# Patient Record
Sex: Female | Born: 1990 | Race: White | Hispanic: No | Marital: Single | Smoking: Never smoker
Health system: Southern US, Community
[De-identification: ages and names within clinical notes are randomized; demographics above are authoritative.]

## PROBLEM LIST (undated history)

## (undated) DIAGNOSIS — D649 Anemia, unspecified: Secondary | ICD-10-CM

## (undated) DIAGNOSIS — I499 Cardiac arrhythmia, unspecified: Secondary | ICD-10-CM

## (undated) DIAGNOSIS — R011 Cardiac murmur, unspecified: Secondary | ICD-10-CM

## (undated) DIAGNOSIS — E0590 Thyrotoxicosis, unspecified without thyrotoxic crisis or storm: Secondary | ICD-10-CM

## (undated) DIAGNOSIS — E079 Disorder of thyroid, unspecified: Secondary | ICD-10-CM

## (undated) HISTORY — DX: Cardiac arrhythmia, unspecified: I49.9

## (undated) HISTORY — DX: Cardiac murmur, unspecified: R01.1

## (undated) HISTORY — DX: Thyrotoxicosis, unspecified without thyrotoxic crisis or storm: E05.90

---

## 2018-04-29 ENCOUNTER — Encounter (HOSPITAL_COMMUNITY): Payer: Self-pay

## 2018-04-29 ENCOUNTER — Other Ambulatory Visit: Payer: Self-pay

## 2018-04-29 ENCOUNTER — Emergency Department (HOSPITAL_COMMUNITY): Payer: Medicaid Other

## 2018-04-29 ENCOUNTER — Emergency Department (HOSPITAL_COMMUNITY)
Admission: EM | Admit: 2018-04-29 | Discharge: 2018-04-29 | Disposition: A | Discharge status: Home / Self Care | Payer: Medicaid Other | Attending: Emergency Medicine | Admitting: Emergency Medicine

## 2018-04-29 DIAGNOSIS — R1030 Lower abdominal pain, unspecified: Secondary | ICD-10-CM | POA: Diagnosis not present

## 2018-04-29 DIAGNOSIS — O9989 Other specified diseases and conditions complicating pregnancy, childbirth and the puerperium: Secondary | ICD-10-CM | POA: Insufficient documentation

## 2018-04-29 DIAGNOSIS — Z3A15 15 weeks gestation of pregnancy: Secondary | ICD-10-CM

## 2018-04-29 DIAGNOSIS — N939 Abnormal uterine and vaginal bleeding, unspecified: Secondary | ICD-10-CM

## 2018-04-29 DIAGNOSIS — W19XXXS Unspecified fall, sequela: Secondary | ICD-10-CM

## 2018-04-29 HISTORY — DX: Disorder of thyroid, unspecified: E07.9

## 2018-04-29 HISTORY — DX: Anemia, unspecified: D64.9

## 2018-04-29 LAB — COMPREHENSIVE METABOLIC PANEL
ALK PHOS: 56 U/L (ref 38–126)
ALT: 10 U/L (ref 0–44)
AST: 13 U/L — AB (ref 15–41)
Albumin: 3.2 g/dL — ABNORMAL LOW (ref 3.5–5.0)
Anion gap: 7 (ref 5–15)
BUN: 8 mg/dL (ref 6–20)
CALCIUM: 8.9 mg/dL (ref 8.9–10.3)
CHLORIDE: 107 mmol/L (ref 98–111)
CO2: 23 mmol/L (ref 22–32)
Creatinine, Ser: 0.51 mg/dL (ref 0.44–1.00)
Glucose, Bld: 98 mg/dL (ref 70–99)
Potassium: 3.5 mmol/L (ref 3.5–5.1)
Sodium: 137 mmol/L (ref 135–145)
Total Bilirubin: 0.4 mg/dL (ref 0.3–1.2)
Total Protein: 6.6 g/dL (ref 6.5–8.1)

## 2018-04-29 LAB — CBC WITH DIFFERENTIAL/PLATELET
Abs Immature Granulocytes: 0 10*3/uL (ref 0.0–0.1)
Basophils Absolute: 0 10*3/uL (ref 0.0–0.1)
Basophils Relative: 0 %
Eosinophils Absolute: 0.1 10*3/uL (ref 0.0–0.7)
Eosinophils Relative: 1 %
HEMATOCRIT: 34.9 % — AB (ref 36.0–46.0)
HEMOGLOBIN: 11.5 g/dL — AB (ref 12.0–15.0)
IMMATURE GRANULOCYTES: 0 %
LYMPHS ABS: 2.4 10*3/uL (ref 0.7–4.0)
LYMPHS PCT: 30 %
MCH: 29.8 pg (ref 26.0–34.0)
MCHC: 33 g/dL (ref 30.0–36.0)
MCV: 90.4 fL (ref 78.0–100.0)
Monocytes Absolute: 0.5 10*3/uL (ref 0.1–1.0)
Monocytes Relative: 6 %
NEUTROS PCT: 63 %
Neutro Abs: 5.1 10*3/uL (ref 1.7–7.7)
Platelets: 259 10*3/uL (ref 150–400)
RBC: 3.86 MIL/uL — AB (ref 3.87–5.11)
RDW: 12.2 % (ref 11.5–15.5)
WBC: 8.1 10*3/uL (ref 4.0–10.5)

## 2018-04-29 LAB — TYPE AND SCREEN
ABO/RH(D): O POS
ANTIBODY SCREEN: NEGATIVE

## 2018-04-29 LAB — HCG, QUANTITATIVE, PREGNANCY: HCG, BETA CHAIN, QUANT, S: 71929 m[IU]/mL — AB (ref <5)

## 2018-04-29 LAB — URINALYSIS, ROUTINE W REFLEX MICROSCOPIC
BILIRUBIN URINE: NEGATIVE
Glucose, UA: NEGATIVE mg/dL
Hgb urine dipstick: NEGATIVE
Ketones, ur: NEGATIVE mg/dL
Leukocytes, UA: NEGATIVE
NITRITE: NEGATIVE
PH: 6 (ref 5.0–8.0)
PROTEIN: NEGATIVE mg/dL
Specific Gravity, Urine: 1.025 (ref 1.005–1.030)

## 2018-04-29 LAB — I-STAT BETA HCG BLOOD, ED (MC, WL, AP ONLY): I-stat hCG, quantitative: >2000 m[IU]/mL — ABNORMAL HIGH (ref <5)

## 2018-04-29 LAB — ABO/RH: ABO/RH(D): O POS

## 2018-04-29 NOTE — ED Provider Notes (Signed)
[facility]ENC[facility] Provider Note   CSN: [id number] Arrival date & time: 04/29/18  1535     History   Chief Complaint Chief Complaint  Patient presents with  . Abdominal Pain    HPI Martha Macias is a 27 y.o. pregnant female with a history of hyperthyroidism who presents for abdominal pain and vaginal bleeding after a fall yesterday. She reports that she tripped over a bucket and landed on the bucket on her lower stomach and upper thighs. Since then she has had lower back pain across the whole back and lower abdominal pain. This morning she began to feel abdominal cramping and noticed minimal spotting. She denies any vaginal discharge or dysuria. Her LMP was 3/30 and home pregnancy tests have been positive, but she has not yet seen an ob-gyn for prenatal care.   Past Medical History:  Diagnosis Date  . Anemia   . Thyroid disease     There are no active problems to display for this patient.   History reviewed. No pertinent surgical history.   OB History: G6P5. All vaginal deliveries.  Home Medications    Prior to Admission medications   Not on File    Family History No family history on file.  Social History Social History   Tobacco Use  . Smoking status: Never Smoker  . Smokeless tobacco: Never Used  Substance Use Topics  . Alcohol use: Never    Frequency: Never  . Drug use: Never     Allergies   Patient has no known allergies.   Review of Systems Review of Systems  Constitutional: Negative for chills and fatigue.  HENT: Negative for sinus pain and sore throat.   Respiratory: Negative for cough and shortness of breath.   Cardiovascular: Negative for chest pain and leg swelling.  Gastrointestinal: Positive for abdominal pain. Negative for nausea and vomiting.  Genitourinary: Positive for vaginal bleeding. Negative for difficulty urinating, dysuria, flank pain, pelvic pain, vaginal discharge and vaginal pain.    Musculoskeletal: Positive for back pain.  Skin: Negative for rash and wound.  Neurological: Negative for light-headedness and headaches.     Physical Exam Updated Vital Signs BP 113/60 (BP Location: Left Arm)   Pulse 78   Temp 98.3 F (36.8 C) (Oral)   Resp 14   Ht 5\' 7"  (1.702 m)   Wt 67.1 kg (148 lb)   LMP 01/15/2018   SpO2 100%   BMI 23.18 kg/m   Physical Exam  Constitutional: She is oriented to person, place, and time. She appears well-developed and well-nourished. No distress.  HENT:  Head: Normocephalic and atraumatic.  Mouth/Throat: Oropharynx is clear and moist.  Eyes: Pupils are equal, round, and reactive to light. EOM are normal.  Cardiovascular: Normal rate and regular rhythm. Exam reveals no gallop and no friction rub.  No murmur heard. Pulmonary/Chest: Effort normal and breath sounds normal. No respiratory distress. She has no wheezes. She has no rhonchi. She has no rales.  Abdominal: Normal appearance and bowel sounds are normal. There is tenderness in the suprapubic area. There is no CVA tenderness.  Gravid.  Genitourinary: Vagina normal. Uterus is enlarged. Uterus is not tender. Cervix exhibits no motion tenderness and no discharge. Right adnexum displays no mass, no tenderness and no fullness. Left adnexum displays no mass, no tenderness and no fullness. No bleeding in the vagina. No signs of injury around the vagina.  Genitourinary Comments: Os closed.  Musculoskeletal:  Mild tenderness to palpation along lower back.  No bony tenderness or deformities.  Neurological: She is alert and oriented to person, place, and time.  Skin: Skin is warm and dry. Capillary refill takes less than 2 seconds. No rash noted.  Psychiatric: She has a normal mood and affect. Her behavior is normal.     ED Treatments / Results  Labs (all labs ordered are listed, but only abnormal results are displayed) Labs Reviewed  COMPREHENSIVE METABOLIC PANEL - Abnormal; Notable for the  following components:      Result Value   Albumin 3.2 (*)    AST 13 (*)    All other components within normal limits  CBC WITH DIFFERENTIAL/PLATELET - Abnormal; Notable for the following components:   RBC 3.86 (*)    Hemoglobin 11.5 (*)    HCT 34.9 (*)    All other components within normal limits  HCG, QUANTITATIVE, PREGNANCY - Abnormal; Notable for the following components:   hCG, Beta Chain, Quant, S 71,929 (*)    All other components within normal limits  I-STAT BETA HCG BLOOD, ED ([facility], [facility], [facility] ONLY) - Abnormal; Notable for the following components:   I-stat hCG, quantitative >2,000.0 (*)    All other components within normal limits  URINALYSIS, ROUTINE W REFLEX MICROSCOPIC    EKG None  Radiology No results found.  Procedures Procedures (including critical care time)  Medications Ordered in ED Medications - No data to display   Initial Impression / Assessment and Plan / ED Course  I have reviewed the triage vital signs and the nursing notes.  Pertinent labs & imaging results that were available during my care of the patient were reviewed by me and considered in my medical decision making (see chart for details).  Martha Macias is a 27 y.o. 15-week pregnant female with a history of hyperthyroidism who presents for abdominal pain and minimal vaginal spotting after a fall onto her stomach yesterday. Upon arrival to the ED, she was afebrile and hemodynamically stable. Labs were significant for positive b-hcg. Physical exam was notable for suprapubic tenderness to palpation. Pelvic exam showed no vaginal bleeding, no cervical motion tenderness, and a closed cervical os. UA was negative. Transabdominal US showed living single intrauterine pregnancy at 15 weeks 0 days with a small amount of subchorionic fluid that could be minor subchorionic hemorrhage. Type and screen was drawn for blood type evaluation/RhoGAM administration upon f/u with her OB-GYN within 72 hours. Martha Macias was deemed  safe for discharge home with return precautions of further vaginal bleeding, abdominal pain, vaginal discharge, or any other new or concerning symptoms. She was advised to f/u with an OB-GYN within 72 hours. She was provided with a referral number for an OB-GYN.   Final Clinical Impressions(s) / ED Diagnoses   Final diagnoses:  None    ED Discharge Orders    None       Dorrell, Cathleen Corti, MD 04/29/18 2036    Melene Plan, DO 04/29/18 2107

## 2018-04-29 NOTE — ED Notes (Signed)
ED Provider at bedside. 

## 2018-04-29 NOTE — ED Provider Notes (Signed)
MSE was initiated and I personally evaluated the patient and placed orders (if any) at  4:01 PM on April 29, 2018.  The patient appears stable so that the remainder of the MSE may be completed by another provider.  Patient placed in Quick Look pathway, seen and evaluated   Chief Complaint: pregnant, vaginal bleeding, lower abdominal cramping  HPI:   Patient presents to ED for evaluation of lower abdominal cramping, vaginal spotting, back pain since yesterday.  Last menstrual period was March 30 of this year.  Has taken several positive home pregnancy test.  She moved here from [place] and has not yet established with an OB/GYN.  Has not had a ultrasound for this pregnancy yet.  She was at work yesterday when she tripped on a bucket and fell onto her stomach.  She began having the pain today.  Denies any head injury or loss of consciousness.  Amount of bleeding relates to changing a light pad 1-2x a day.  Prior pregnancies have been successful.  Denies history of ectopic.  Denies any lightheadedness, dizziness, numbness in legs, urinary symptoms.  ROS: abdominal pain  Physical Exam:   Gen: No distress  Neuro: Awake and Alert  Skin: Warm    Focused Exam: NAD. Suprapubic tenderness to palpation with no rebound or guarding noted.   Initiation of care has begun. The patient has been counseled on the process, plan, and necessity for staying for the completion/evaluation, and the remainder of the medical screening examination    Martha Macias, Martha Stampley, PA-C 04/29/18 1603    Gerhard MunchLockwood, Robert, MD 04/29/18 2252

## 2018-04-29 NOTE — ED Triage Notes (Signed)
Pt states that she is pregnant with last menstrual cycle being March 30 th states that her conformation was with home pregnancy test and has not had OB appointment yet. Reports that yesterday she was at work and tripped, with her stomach landing on a bucket. Since she has been having back pain and spotting.

## 2018-04-29 NOTE — ED Notes (Signed)
Patient transported to Ultrasound 

## 2018-04-29 NOTE — Discharge Instructions (Addendum)
Congratulations on your pregnancy! We performed an ultrasound because you fell on your belly and had abdominal cramping and spotting. The ultrasound showed that your baby is alive and well. The only abnormality was a very small amount of subchorionic fluid that could be minor subchorionic bleed. Please follow up with an ob-gyn to re-evaluate this and to receive prenatal care. Please inform your ob-gyn that we did lab work to determine your blood type. They can decide if you need a medication called RhoGAM or not. Please return to the ED if you continue to have vaginal bleeding, abdominal pain, vaginal discharge, or any other new or concerning symptoms.

## 2018-05-16 ENCOUNTER — Telehealth: Payer: Self-pay | Admitting: *Deleted

## 2018-05-16 ENCOUNTER — Encounter: Payer: Self-pay | Admitting: Obstetrics & Gynecology

## 2018-05-16 NOTE — Telephone Encounter (Signed)
Patient thought she was scheduling [place] app[place] in [place] but it was [place]. Got her an appt set there for 05/25/18 at 3:15pm with Dr. Clearance CootsHarper. I left patient a voicemail with [facility] office contact number just in case she needs to change something.

## 2018-05-18 NOTE — Progress Notes (Signed)
This encounter was created in error - please disregard.

## 2018-05-25 ENCOUNTER — Ambulatory Visit (INDEPENDENT_AMBULATORY_CARE_PROVIDER_SITE_OTHER): Payer: Medicaid Other | Admitting: Obstetrics

## 2018-05-25 ENCOUNTER — Encounter: Payer: Self-pay | Admitting: Obstetrics

## 2018-05-25 ENCOUNTER — Other Ambulatory Visit (HOSPITAL_COMMUNITY)
Admission: RE | Admit: 2018-05-25 | Discharge: 2018-05-25 | Disposition: A | Discharge status: Home / Self Care | Payer: Medicaid Other | Source: Ambulatory Visit | Attending: Obstetrics | Admitting: Obstetrics

## 2018-05-25 VITALS — BP 118/67 | HR 84 | Wt 163.8 lb

## 2018-05-25 DIAGNOSIS — O0990 Supervision of high risk pregnancy, unspecified, unspecified trimester: Secondary | ICD-10-CM

## 2018-05-25 DIAGNOSIS — Z3A18 18 weeks gestation of pregnancy: Secondary | ICD-10-CM | POA: Diagnosis not present

## 2018-05-25 DIAGNOSIS — O0992 Supervision of high risk pregnancy, unspecified, second trimester: Secondary | ICD-10-CM

## 2018-05-25 DIAGNOSIS — O99282 Endocrine, nutritional and metabolic diseases complicating pregnancy, second trimester: Secondary | ICD-10-CM

## 2018-05-25 DIAGNOSIS — O99280 Endocrine, nutritional and metabolic diseases complicating pregnancy, unspecified trimester: Secondary | ICD-10-CM

## 2018-05-25 DIAGNOSIS — Z3480 Encounter for supervision of other normal pregnancy, unspecified trimester: Secondary | ICD-10-CM | POA: Insufficient documentation

## 2018-05-25 DIAGNOSIS — E0590 Thyrotoxicosis, unspecified without thyrotoxic crisis or storm: Secondary | ICD-10-CM

## 2018-05-25 MED ORDER — VITAFOL ULTRA 29-0.6-0.4-200 MG PO CAPS: 1.0000 | ORAL_CAPSULE | Freq: Every day | ORAL | 4 refills | Status: DC

## 2018-05-25 NOTE — Progress Notes (Signed)
Pt presents for NOB and this is not a planned pregnancy. She lives with FOB and he is supportive.

## 2018-05-25 NOTE — Progress Notes (Signed)
Subjective:    Martha Macias is being seen today for her first obstetrical visit.  This is not a planned pregnancy. She is at 4676w4d gestation. Her obstetrical history is significant for hyperthyroidism. Relationship with FOB: unknown. Patient was not asked if she intend to breast feed. Pregnancy history fully reviewed.  The information documented in the HPI was reviewed and verified.  Menstrual History: OB History    Gravida  6   Para  5   Term  5   Preterm      AB      Living  5     SAB      TAB      Ectopic      Multiple      Live Births  5            Patient's last menstrual period was 01/15/2018.    Past Medical History:  Diagnosis Date  . Anemia   . Hyperthyroidism   . Thyroid disease     History reviewed. No pertinent surgical history.   (Not in a hospital admission) No Known Allergies  Social History   Tobacco Use  . Smoking status: Never Smoker  . Smokeless tobacco: Never Used  Substance Use Topics  . Alcohol use: Not Currently    Frequency: Never    Family History  Problem Relation Age of Onset  . Diabetes type I Mother      Review of Systems Constitutional: negative for weight loss Gastrointestinal: negative for vomiting Genitourinary:negative for genital lesions and vaginal discharge and dysuria Musculoskeletal:negative for back pain Behavioral/Psych: negative for abusive relationship, depression, illegal drug usage and tobacco use    Objective:    BP 118/67   Pulse 84   Wt 163 lb 12.8 oz (74.3 kg)   LMP 01/15/2018   BMI 25.65 kg/m  General Appearance:    Alert, cooperative, no distress, appears stated age  Head:    Normocephalic, without obvious abnormality, atraumatic  Eyes:    PERRL, conjunctiva/corneas clear, EOM's intact, fundi    benign, both eyes  Ears:    Normal TM's and external ear canals, both ears  Nose:   Nares normal, septum midline, mucosa normal, no drainage    or sinus tenderness  Throat:   Lips, mucosa, and  tongue normal; teeth and gums normal  Neck:   Supple, symmetrical, trachea midline, no adenopathy;    thyroid:  no enlargement/tenderness/nodules; no carotid   bruit or JVD  Back:     Symmetric, no curvature, ROM normal, no CVA tenderness  Lungs:     Clear to auscultation bilaterally, respirations unlabored  Chest Wall:    No tenderness or deformity   Heart:    Regular rate and rhythm, S1 and S2 normal, no murmur, rub   or gallop  Breast Exam:    No tenderness, masses, or nipple abnormality  Abdomen:     Soft, non-tender, bowel sounds active all four quadrants,    no masses, no organomegaly  Genitalia:    Normal female without lesion, discharge or tenderness  Extremities:   Extremities normal, atraumatic, no cyanosis or edema  Pulses:   2+ and symmetric all extremities  Skin:   Skin color, texture, turgor normal, no rashes or lesions  Lymph nodes:   Cervical, supraclavicular, and axillary nodes normal  Neurologic:   CNII-XII intact, normal strength, sensation and reflexes    throughout      Lab Review Urine pregnancy test Labs reviewed yes Radiologic studies  reviewed no   Assessment and Plan:    Pregnancy at [redacted]w[redacted]d weeks    1. Supervision of high risk pregnancy, antepartum Rx: - Enroll Patient in Babyscripts - Hemoglobinopathy evaluation - Obstetric Panel, Including HIV - Culture, OB Urine - Cervicovaginal ancillary only - Cytology - PAP - Cystic Fibrosis Mutation 97 - Genetic Screening - AFP, Serum, Open Spina Bifida - [place] MFM OB COMP + 14 WK; Future  2. Hyperthyroidism affecting pregnancy, antepartum Rx: - Ambulatory referral to Endocrinology - TSH    Plan:     Prenatal vitamins.  Counseling provided regarding continued use of seat belts, cessation of alcohol consumption, smoking or use of illicit drugs; infection precautions i.e., influenza/TDAP immunizations, toxoplasmosis,CMV, parvovirus, listeria and varicella; workplace safety, exercise during pregnancy;  routine dental care, safe medications, sexual activity, hot tubs, saunas, pools, travel, caffeine use, fish and methlymercury, potential toxins, hair treatments, varicose veins Weight gain recommendations per IOM guidelines reviewed: underweight/BMI< 18.5--> gain 28 - 40 lbs; normal weight/BMI 18.5 - 24.9--> gain 25 - 35 lbs; overweight/BMI 25 - 29.9--> gain 15 - 25 lbs; obese/BMI >30->gain  11 - 20 lbs Problem list reviewed and updated. FIRST/CF mutation testing/NIPT/QUAD SCREEN/fragile X/Ashkenazi Jewish population testing/Spinal muscular atrophy discussed: requested. Role of ultrasound in pregnancy discussed; fetal survey: requested. Amniocentesis discussed: not indicated.  Meds ordered this encounter  Medications  . Prenat-Fe Poly-Methfol-FA-DHA (VITAFOL ULTRA) 29-0.6-0.4-200 MG CAPS    Sig: Take 1 tablet by mouth daily before breakfast.    Dispense:  90 capsule    Refill:  4   Orders Placed This Encounter  Procedures  . Culture, OB Urine  . [place] MFM OB COMP + 14 WK    Standing Status:   Future    Standing Expiration Date:   07/25/2019    Order Specific Question:   Reason for Exam (SYMPTOM  OR DIAGNOSIS REQUIRED)    Answer:   Needs anatomy scan    Order Specific Question:   Preferred imaging location?    Answer:   [facility]  . Hemoglobinopathy evaluation  . Obstetric Panel, Including HIV  . Cystic Fibrosis Mutation 97  . Genetic Screening  . AFP, Serum, Open Spina Bifida    Order Specific Question:   Is patient insulin dependent?    Answer:   No    Order Specific Question:   Patient weight (lb.)    Answer:   159 lb 14.4 oz (72.5 kg)    Order Specific Question:   Gestational Age (GA), weeks    Answer:   18.4    Order Specific Question:   Date on which patient was at this GA    Answer:   05/25/2018    Order Specific Question:   GA Calculation Method    Answer:   LMP    Order Specific Question:   Reason for screen    Answer:   OTHER    Comments:   routine   . TSH  .  Ambulatory referral to Infertility    Referral Priority:   Routine    Referral Type:   Consultation    Referral Reason:   Specialty Services Required    Requested Specialty:   Obstetrics    Number of Visits Requested:   1    Follow up in 4 weeks. 50% of 20 min visit spent on counseling and coordination of care.     Brock Bad MD 05-25-2018

## 2018-05-26 LAB — TSH: TSH: 0.785 u[IU]/mL (ref 0.450–4.500)

## 2018-05-27 ENCOUNTER — Encounter (HOSPITAL_COMMUNITY): Payer: Self-pay

## 2018-05-27 LAB — CERVICOVAGINAL ANCILLARY ONLY
Bacterial vaginitis: NEGATIVE
Candida vaginitis: NEGATIVE
Chlamydia: NEGATIVE
NEISSERIA GONORRHEA: NEGATIVE
Trichomonas: NEGATIVE

## 2018-05-27 LAB — CYTOLOGY - PAP: Diagnosis: NEGATIVE

## 2018-05-28 LAB — CULTURE, OB URINE

## 2018-05-28 LAB — URINE CULTURE, OB REFLEX

## 2018-05-30 ENCOUNTER — Encounter: Payer: Self-pay | Admitting: Obstetrics

## 2018-06-02 LAB — HEMOGLOBINOPATHY EVALUATION
HEMOGLOBIN F QUANTITATION: 0 % (ref 0.0–2.0)
HGB A: 97.6 % (ref 96.4–98.8)
HGB C: 0 %
HGB S: 0 %
HGB VARIANT: 0 %
Hemoglobin A2 Quantitation: 2.4 % (ref 1.8–3.2)

## 2018-06-02 LAB — OBSTETRIC PANEL, INCLUDING HIV
ANTIBODY SCREEN: NEGATIVE
BASOS: 0 %
Basophils Absolute: 0 10*3/uL (ref 0.0–0.2)
EOS (ABSOLUTE): 0.1 10*3/uL (ref 0.0–0.4)
EOS: 2 %
HEMATOCRIT: 34.7 % (ref 34.0–46.6)
HEMOGLOBIN: 11.5 g/dL (ref 11.1–15.9)
HIV Screen 4th Generation wRfx: NONREACTIVE
Hepatitis B Surface Ag: NEGATIVE
IMMATURE GRANS (ABS): 0 10*3/uL (ref 0.0–0.1)
Immature Granulocytes: 0 %
LYMPHS: 24 %
Lymphocytes Absolute: 1.9 10*3/uL (ref 0.7–3.1)
MCH: 30.1 pg (ref 26.6–33.0)
MCHC: 33.1 g/dL (ref 31.5–35.7)
MCV: 91 fL (ref 79–97)
MONOS ABS: 0.5 10*3/uL (ref 0.1–0.9)
Monocytes: 7 %
Neutrophils Absolute: 5.5 10*3/uL (ref 1.4–7.0)
Neutrophils: 67 %
Platelets: 278 10*3/uL (ref 150–450)
RBC: 3.82 x10E6/uL (ref 3.77–5.28)
RDW: 12.8 % (ref 12.3–15.4)
RH TYPE: POSITIVE
RPR Ser Ql: NONREACTIVE
Rubella Antibodies, IGG: 1.49 index (ref >0.99)
WBC: 8.1 10*3/uL (ref 3.4–10.8)

## 2018-06-02 LAB — AFP, SERUM, OPEN SPINA BIFIDA
AFP MoM: 0.93
AFP VALUE AFPOSL: 39.8 ng/mL
Gest. Age on Collection Date: 18.4 weeks
Maternal Age At EDD: 29.9 yr
OSBR RISK 1 IN: 10000
Test Results:: NEGATIVE
Weight: 159 [lb_av]

## 2018-06-02 LAB — CYSTIC FIBROSIS MUTATION 97: Interpretation: NOT DETECTED

## 2018-06-03 ENCOUNTER — Other Ambulatory Visit (HOSPITAL_COMMUNITY): Payer: Self-pay | Admitting: *Deleted

## 2018-06-03 ENCOUNTER — Ambulatory Visit (HOSPITAL_COMMUNITY)
Admission: RE | Admit: 2018-06-03 | Discharge: 2018-06-03 | Disposition: A | Discharge status: Home / Self Care | Payer: Medicaid Other | Source: Ambulatory Visit | Attending: Obstetrics | Admitting: Obstetrics

## 2018-06-03 DIAGNOSIS — O99282 Endocrine, nutritional and metabolic diseases complicating pregnancy, second trimester: Secondary | ICD-10-CM | POA: Diagnosis not present

## 2018-06-03 DIAGNOSIS — E0590 Thyrotoxicosis, unspecified without thyrotoxic crisis or storm: Secondary | ICD-10-CM | POA: Insufficient documentation

## 2018-06-03 DIAGNOSIS — O0990 Supervision of high risk pregnancy, unspecified, unspecified trimester: Secondary | ICD-10-CM

## 2018-06-03 DIAGNOSIS — Z362 Encounter for other antenatal screening follow-up: Secondary | ICD-10-CM

## 2018-06-03 DIAGNOSIS — Z363 Encounter for antenatal screening for malformations: Secondary | ICD-10-CM | POA: Diagnosis not present

## 2018-06-03 DIAGNOSIS — Z3A19 19 weeks gestation of pregnancy: Secondary | ICD-10-CM | POA: Insufficient documentation

## 2018-06-03 DIAGNOSIS — O0932 Supervision of pregnancy with insufficient antenatal care, second trimester: Secondary | ICD-10-CM | POA: Diagnosis not present

## 2018-06-03 DIAGNOSIS — O0992 Supervision of high risk pregnancy, unspecified, second trimester: Secondary | ICD-10-CM | POA: Diagnosis not present

## 2018-06-22 ENCOUNTER — Encounter: Payer: Self-pay | Admitting: Obstetrics

## 2018-06-22 ENCOUNTER — Ambulatory Visit (INDEPENDENT_AMBULATORY_CARE_PROVIDER_SITE_OTHER): Payer: Medicaid Other | Admitting: Obstetrics

## 2018-06-22 VITALS — BP 136/72 | HR 97 | Wt 172.4 lb

## 2018-06-22 DIAGNOSIS — E0590 Thyrotoxicosis, unspecified without thyrotoxic crisis or storm: Secondary | ICD-10-CM

## 2018-06-22 DIAGNOSIS — O99280 Endocrine, nutritional and metabolic diseases complicating pregnancy, unspecified trimester: Secondary | ICD-10-CM

## 2018-06-22 DIAGNOSIS — O99282 Endocrine, nutritional and metabolic diseases complicating pregnancy, second trimester: Secondary | ICD-10-CM

## 2018-06-22 DIAGNOSIS — O0992 Supervision of high risk pregnancy, unspecified, second trimester: Secondary | ICD-10-CM

## 2018-06-22 DIAGNOSIS — O0990 Supervision of high risk pregnancy, unspecified, unspecified trimester: Secondary | ICD-10-CM

## 2018-06-22 NOTE — Progress Notes (Signed)
Pt is G6P5 [redacted]w[redacted]d here for ROB.

## 2018-06-22 NOTE — Progress Notes (Signed)
Subjective:  Martha Macias is a 27 y.o. G6P5005 at [redacted]w[redacted]d being seen today for ongoing prenatal care.  She is currently monitored for the following issues for this high-risk pregnancy and has Supervision of other normal pregnancy, antepartum on their problem list.  Patient reports no complaints.  Contractions: Not present. Vag. Bleeding: None.  Movement: Present. Denies leaking of fluid.   The following portions of the patient's history were reviewed and updated as appropriate: allergies, current medications, past family history, past medical history, past social history, past surgical history and problem list. Problem list updated.  Objective:   Vitals:   06/22/18 0912  BP: 136/72  Pulse: 97  Weight: 172 lb 6.4 oz (78.2 kg)    Fetal Status: Fetal Heart Rate (bpm): 150   Movement: Present     General:  Alert, oriented and cooperative. Patient is in no acute distress.  Skin: Skin is warm and dry. No rash noted.   Cardiovascular: Normal heart rate noted  Respiratory: Normal respiratory effort, no problems with respiration noted  Abdomen: Soft, gravid, appropriate for gestational age. Pain/Pressure: Absent     Pelvic:  Cervical exam deferred        Extremities: Normal range of motion.  Edema: Trace  Mental Status: Normal mood and affect. Normal behavior. Normal judgment and thought content.   Urinalysis:      Assessment and Plan:  Pregnancy: G6P5005 at [redacted]w[redacted]d  1. Supervision of high risk pregnancy, antepartum  2. Hyperthyroidism affecting pregnancy, antepartum   Preterm labor symptoms and general obstetric precautions including but not limited to vaginal bleeding, contractions, leaking of fluid and fetal movement were reviewed in detail with the patient. Please refer to After Visit Summary for other counseling recommendations.  Return in about 4 weeks (around 07/20/2018) for ROB.   Brock Bad, MD

## 2018-06-29 ENCOUNTER — Encounter: Payer: Self-pay | Admitting: Family Medicine

## 2018-06-29 DIAGNOSIS — O99280 Endocrine, nutritional and metabolic diseases complicating pregnancy, unspecified trimester: Secondary | ICD-10-CM

## 2018-06-29 DIAGNOSIS — O0930 Supervision of pregnancy with insufficient antenatal care, unspecified trimester: Secondary | ICD-10-CM | POA: Insufficient documentation

## 2018-06-29 DIAGNOSIS — Z641 Problems related to multiparity: Secondary | ICD-10-CM | POA: Insufficient documentation

## 2018-06-29 DIAGNOSIS — E0590 Thyrotoxicosis, unspecified without thyrotoxic crisis or storm: Secondary | ICD-10-CM | POA: Insufficient documentation

## 2018-07-01 ENCOUNTER — Ambulatory Visit (HOSPITAL_COMMUNITY)
Admission: RE | Admit: 2018-07-01 | Discharge: 2018-07-01 | Disposition: A | Discharge status: Home / Self Care | Payer: Medicaid Other | Source: Ambulatory Visit | Attending: Obstetrics | Admitting: Obstetrics

## 2018-07-01 DIAGNOSIS — Z3A23 23 weeks gestation of pregnancy: Secondary | ICD-10-CM | POA: Insufficient documentation

## 2018-07-01 DIAGNOSIS — O99282 Endocrine, nutritional and metabolic diseases complicating pregnancy, second trimester: Secondary | ICD-10-CM | POA: Insufficient documentation

## 2018-07-01 DIAGNOSIS — E0590 Thyrotoxicosis, unspecified without thyrotoxic crisis or storm: Secondary | ICD-10-CM | POA: Insufficient documentation

## 2018-07-01 DIAGNOSIS — Z362 Encounter for other antenatal screening follow-up: Secondary | ICD-10-CM | POA: Insufficient documentation

## 2018-07-04 ENCOUNTER — Other Ambulatory Visit: Payer: Self-pay

## 2018-07-04 NOTE — Progress Notes (Signed)
TC from pt regarding need to change restrictions letter since she works part time.  Pt states she only works 5 hrs a day and is allowed breaks ok per Dr.Harper to change and fax to pt job.

## 2018-07-20 ENCOUNTER — Encounter: Payer: Self-pay | Admitting: Obstetrics

## 2018-07-20 ENCOUNTER — Ambulatory Visit (INDEPENDENT_AMBULATORY_CARE_PROVIDER_SITE_OTHER): Payer: Medicaid Other | Admitting: Obstetrics

## 2018-07-20 VITALS — BP 116/68 | HR 78 | Wt 176.0 lb

## 2018-07-20 DIAGNOSIS — E0590 Thyrotoxicosis, unspecified without thyrotoxic crisis or storm: Secondary | ICD-10-CM

## 2018-07-20 DIAGNOSIS — O99282 Endocrine, nutritional and metabolic diseases complicating pregnancy, second trimester: Secondary | ICD-10-CM

## 2018-07-20 DIAGNOSIS — O99280 Endocrine, nutritional and metabolic diseases complicating pregnancy, unspecified trimester: Secondary | ICD-10-CM

## 2018-07-20 DIAGNOSIS — O0990 Supervision of high risk pregnancy, unspecified, unspecified trimester: Secondary | ICD-10-CM

## 2018-07-20 DIAGNOSIS — O0992 Supervision of high risk pregnancy, unspecified, second trimester: Secondary | ICD-10-CM

## 2018-07-20 NOTE — Progress Notes (Signed)
Subjective:  Martha Macias is a 27 y.o. G6P5005 at [redacted]w[redacted]d being seen today for ongoing prenatal care.  She is currently monitored for the following issues for this high-risk pregnancy and has Supervision of other normal pregnancy, antepartum; Late prenatal care affecting pregnancy, antepartum; Grand multiparity; and Hyperthyroidism affecting pregnancy on their problem list.  Patient reports no complaints.  Contractions: Not present. Vag. Bleeding: None.  Movement: Present. Denies leaking of fluid.   The following portions of the patient's history were reviewed and updated as appropriate: allergies, current medications, past family history, past medical history, past social history, past surgical history and problem list. Problem list updated.  Objective:   Vitals:   07/20/18 1005  BP: 116/68  Pulse: 78  Weight: 176 lb (79.8 kg)    Fetal Status: Fetal Heart Rate (bpm): 150   Movement: Present     General:  Alert, oriented and cooperative. Patient is in no acute distress.  Skin: Skin is warm and dry. No rash noted.   Cardiovascular: Normal heart rate noted  Respiratory: Normal respiratory effort, no problems with respiration noted  Abdomen: Soft, gravid, appropriate for gestational age. Pain/Pressure: Present     Pelvic:  Cervical exam deferred        Extremities: Normal range of motion.  Edema: None  Mental Status: Normal mood and affect. Normal behavior. Normal judgment and thought content.   Urinalysis:      Assessment and Plan:  Pregnancy: G6P5005 at [redacted]w[redacted]d  1. Supervision of high risk pregnancy, antepartum  2. Hyperthyroidism affecting pregnancy, antepartum - stable  Preterm labor symptoms and general obstetric precautions including but not limited to vaginal bleeding, contractions, leaking of fluid and fetal movement were reviewed in detail with the patient. Please refer to After Visit Summary for other counseling recommendations.  Return in about 2 weeks (around 08/03/2018)  for ROB.   Brock Bad, MD

## 2018-08-04 ENCOUNTER — Encounter: Payer: Medicaid Other | Admitting: Obstetrics

## 2018-08-04 ENCOUNTER — Other Ambulatory Visit: Payer: Medicaid Other

## 2018-08-09 ENCOUNTER — Other Ambulatory Visit: Payer: Medicaid Other

## 2018-08-09 ENCOUNTER — Encounter: Payer: Medicaid Other | Admitting: Obstetrics & Gynecology

## 2018-08-19 ENCOUNTER — Other Ambulatory Visit: Payer: Medicaid Other

## 2018-08-19 ENCOUNTER — Ambulatory Visit (INDEPENDENT_AMBULATORY_CARE_PROVIDER_SITE_OTHER): Payer: Medicaid Other | Admitting: Obstetrics and Gynecology

## 2018-08-19 ENCOUNTER — Encounter: Payer: Self-pay | Admitting: Obstetrics and Gynecology

## 2018-08-19 VITALS — BP 111/72 | HR 81 | Wt 180.1 lb

## 2018-08-19 DIAGNOSIS — Z23 Encounter for immunization: Secondary | ICD-10-CM

## 2018-08-19 DIAGNOSIS — O99280 Endocrine, nutritional and metabolic diseases complicating pregnancy, unspecified trimester: Secondary | ICD-10-CM

## 2018-08-19 DIAGNOSIS — E0590 Thyrotoxicosis, unspecified without thyrotoxic crisis or storm: Secondary | ICD-10-CM

## 2018-08-19 DIAGNOSIS — Z3480 Encounter for supervision of other normal pregnancy, unspecified trimester: Secondary | ICD-10-CM

## 2018-08-19 DIAGNOSIS — Z641 Problems related to multiparity: Secondary | ICD-10-CM

## 2018-08-19 NOTE — Patient Instructions (Signed)
Intrauterine Device Information An intrauterine device (IUD) is inserted into your uterus to prevent pregnancy. There are two types of IUDs available: Contraception Choices Contraception, also called birth control, refers to methods or devices that prevent pregnancy. Hormonal methods Contraceptive implant A contraceptive implant is a thin, plastic tube that contains a hormone. It is inserted into the upper part of the arm. It can remain in place for up to 3 years. Progestin-only injections Progestin-only injections are injections of progestin, a synthetic form of the hormone progesterone. They are given every 3 months by a health care provider. Birth control pills Birth control pills are pills that contain hormones that prevent pregnancy. They must be taken once a day, preferably at the same time each day. Birth control patch The birth control patch contains hormones that prevent pregnancy. It is placed on the skin and must be changed once a week for three weeks and removed on the fourth week. A prescription is needed to use this method of contraception. Vaginal ring A vaginal ring contains hormones that prevent pregnancy. It is placed in the vagina for three weeks and removed on the fourth week. After that, the process is repeated with a new ring. A prescription is needed to use this method of contraception. Emergency contraceptive Emergency contraceptives prevent pregnancy after unprotected sex. They come in pill form and can be taken up to 5 days after sex. They work best the sooner they are taken after having sex. Most emergency contraceptives are available without a prescription. This method should not be used as your only form of birth control. Barrier methods Female condom A female condom is a thin sheath that is worn over the penis during sex. Condoms keep sperm from going inside a woman's body. They can be used with a spermicide to increase their effectiveness. They should be disposed after a  single use. Female condom A female condom is a soft, loose-fitting sheath that is put into the vagina before sex. The condom keeps sperm from going inside a woman's body. They should be disposed after a single use.  Intrauterine contraception Intrauterine device (IUD) An IUD is a T-shaped device that is put in a woman's uterus. There are two types: Hormone IUD.This type contains progestin, a synthetic form of the hormone progesterone. This type can stay in place for 3-5 years. Copper IUD.This type is wrapped in copper wire. It can stay in place for 10 years.  Permanent methods of contraception Female tubal ligation In this method, a woman's fallopian tubes are sealed, tied, or blocked during surgery to prevent eggs from traveling to the uterus.  Female sterilization This is a procedure to tie off the tubes that carry sperm (vasectomy). After the procedure, the man can still ejaculate fluid (semen).  Summary Contraception, also called birth control, means methods or devices that prevent pregnancy. Hormonal methods of contraception include implants, injections, pills, patches, vaginal rings, and emergency contraceptives. Barrier methods of contraception can include female condoms, female condoms, diaphragms, cervical caps, sponges, and spermicides. There are two types of IUDs (intrauterine devices). An IUD can be put in a woman's uterus to prevent pregnancy for 3-5 years. Permanent sterilization can be done through a procedure for males, females, or both. This information is not intended to replace advice given to you by your health care provider. Make sure you discuss any questions you have with your health care provider. Document Released: 10/05/2005 Document Revised: 11/07/2016 Document Reviewed: 11/07/2016  Elsevier Interactive Patient Education  2018 [employer].  Copper IUD-This type of IUD is w  rapped in copper wire and is placed inside the uterus. Copper makes the uterus and  fallopian tubes produce a fluid that kills sperm. The copper IUD can stay in place for 10 years.  Hormone IUD-This type of IUD contains the hormone progestin (synthetic progesterone). The hormone thickens the cervical mucus and prevents sperm from entering the uterus. It also thins the uterine lining to prevent implantation of a fertilized egg. The hormone can weaken or kill the sperm that get into the uterus. One type of hormone IUD can stay in place for 5 years, and another type can stay in place for 3 years.  Your health care provider will make sure you are a good candidate for a contraceptive IUD. Discuss with your health care provider the possible side effects. Advantages of an intrauterine device  IUDs are highly effective, reversible, long acting, and low maintenance.  There are no estrogen-related side effects.  An IUD can be used when breastfeeding.  IUDs are not associated with weight gain.  The copper IUD works immediately after insertion.  The hormone IUD works right away if inserted within 7 days of your period starting. You will need to use a backup method of birth control for 7 days if the hormone IUD is inserted at any other time in your cycle.  The copper IUD does not interfere with your female hormones.  The hormone IUD can make heavy menstrual periods lighter and decrease cramping.  The hormone IUD can be used for 3 or 5 years.  The copper IUD can be used for 10 years. Disadvantages of an intrauterine device  The hormone IUD can be associated with irregular bleeding patterns.  The copper IUD can make your menstrual flow heavier and more painful.  You may experience cramping and vaginal bleeding after insertion. This information is not intended to replace advice given to you by your health care provider. Make sure you discuss any questions you have with your health care provider. Document Released: 09/08/2004 Document Revised: 03/12/2016 Document Reviewed:  03/26/2013 Elsevier Interactive Patient Education  2017 [employer].

## 2018-08-19 NOTE — Progress Notes (Signed)
   PRENATAL VISIT NOTE  Subjective:  Martha Macias is a 27 y.o. G6P5005 at [redacted]w[redacted]d being seen today for ongoing prenatal care.  She is currently monitored for the following issues for this high-risk pregnancy and has Supervision of other normal pregnancy, antepartum; Late prenatal care affecting pregnancy, antepartum; Grand multiparity; and Hyperthyroidism affecting pregnancy on their problem list.  Patient reports backache. Occasional cramping.  Contractions: Not present. Vag. Bleeding: None.  Movement: Present. Denies leaking of fluid.   The following portions of the patient's history were reviewed and updated as appropriate: allergies, current medications, past family history, past medical history, past social history, past surgical history and problem list. Problem list updated.  Objective:   Vitals:   08/19/18 0853  BP: 111/72  Pulse: 81  Weight: 180 lb 1.6 oz (81.7 kg)    Fetal Status: Fetal Heart Rate (bpm): 149   Movement: Present     General:  Alert, oriented and cooperative. Patient is in no acute distress.  Skin: Skin is warm and dry. No rash noted.   Cardiovascular: Normal heart rate noted  Respiratory: Normal respiratory effort, no problems with respiration noted  Abdomen: Soft, gravid, appropriate for gestational age.  Pain/Pressure: Present     Pelvic: Cervical exam deferred        Extremities: Normal range of motion.  Edema: None  Mental Status: Normal mood and affect. Normal behavior. Normal judgment and thought content.   Assessment and Plan:  Pregnancy: G6P5005 at [redacted]w[redacted]d  1. Supervision of other normal pregnancy, antepartum Reviewed BTL, she will consider Gave info for LARCs  2. Hyperthyroidism affecting pregnancy, antepartum Repeat growth scheduled for 1-2 weeks  3. Grand multiparity   Preterm labor symptoms and general obstetric precautions including but not limited to vaginal bleeding, contractions, leaking of fluid and fetal movement were reviewed in  detail with the patient. Please refer to After Visit Summary for other counseling recommendations.  Return in about 2 weeks (around 09/02/2018) for OB visit.  Future Appointments  Date Time Provider Department Center  09/02/2018  8:45 AM [facility] [place] [facility]  09/05/2018 11:00 AM Constant, Gigi Gin, MD [facility] None    Conan Bowens, MD

## 2018-08-19 NOTE — Progress Notes (Signed)
Pt presents for 2gtt labs and ROB.  Pt c/o mid low back pain and low pelvic pain.

## 2018-08-20 LAB — RPR: RPR Ser Ql: NONREACTIVE

## 2018-08-20 LAB — CBC
HEMOGLOBIN: 10.3 g/dL — AB (ref 11.1–15.9)
Hematocrit: 31.6 % — ABNORMAL LOW (ref 34.0–46.6)
MCH: 29.6 pg (ref 26.6–33.0)
MCHC: 32.6 g/dL (ref 31.5–35.7)
MCV: 91 fL (ref 79–97)
PLATELETS: 247 10*3/uL (ref 150–450)
RBC: 3.48 x10E6/uL — AB (ref 3.77–5.28)
RDW: 11.9 % — AB (ref 12.3–15.4)
WBC: 7.9 10*3/uL (ref 3.4–10.8)

## 2018-08-20 LAB — GLUCOSE TOLERANCE, 2 HOURS W/ 1HR
GLUCOSE, 2 HOUR: 98 mg/dL (ref 65–152)
GLUCOSE, FASTING: 78 mg/dL (ref 65–91)
Glucose, 1 hour: 101 mg/dL (ref 65–179)

## 2018-08-20 LAB — HIV ANTIBODY (ROUTINE TESTING W REFLEX): HIV Screen 4th Generation wRfx: NONREACTIVE

## 2018-09-02 ENCOUNTER — Ambulatory Visit (HOSPITAL_COMMUNITY)
Admission: RE | Admit: 2018-09-02 | Discharge: 2018-09-02 | Disposition: A | Discharge status: Home / Self Care | Payer: Medicaid Other | Source: Ambulatory Visit | Attending: Obstetrics and Gynecology | Admitting: Obstetrics and Gynecology

## 2018-09-02 DIAGNOSIS — O99280 Endocrine, nutritional and metabolic diseases complicating pregnancy, unspecified trimester: Secondary | ICD-10-CM

## 2018-09-02 DIAGNOSIS — E0590 Thyrotoxicosis, unspecified without thyrotoxic crisis or storm: Secondary | ICD-10-CM | POA: Insufficient documentation

## 2018-09-02 DIAGNOSIS — Z3A32 32 weeks gestation of pregnancy: Secondary | ICD-10-CM

## 2018-09-02 DIAGNOSIS — O99283 Endocrine, nutritional and metabolic diseases complicating pregnancy, third trimester: Secondary | ICD-10-CM | POA: Diagnosis present

## 2018-09-05 ENCOUNTER — Encounter: Payer: Self-pay | Admitting: Obstetrics and Gynecology

## 2018-09-05 ENCOUNTER — Ambulatory Visit (INDEPENDENT_AMBULATORY_CARE_PROVIDER_SITE_OTHER): Payer: Medicaid Other | Admitting: Obstetrics and Gynecology

## 2018-09-05 VITALS — BP 109/69 | HR 103 | Wt 182.5 lb

## 2018-09-05 DIAGNOSIS — Z3483 Encounter for supervision of other normal pregnancy, third trimester: Secondary | ICD-10-CM

## 2018-09-05 DIAGNOSIS — O0930 Supervision of pregnancy with insufficient antenatal care, unspecified trimester: Secondary | ICD-10-CM

## 2018-09-05 DIAGNOSIS — Z3480 Encounter for supervision of other normal pregnancy, unspecified trimester: Secondary | ICD-10-CM

## 2018-09-05 DIAGNOSIS — Z641 Problems related to multiparity: Secondary | ICD-10-CM

## 2018-09-05 NOTE — Progress Notes (Signed)
   PRENATAL VISIT NOTE  Subjective:  Martha Macias is a 27 y.o. G6P5005 at 7463w2d being seen today for ongoing prenatal care.  She is currently monitored for the following issues for this low-risk pregnancy and has Supervision of other normal pregnancy, antepartum; Late prenatal care affecting pregnancy, antepartum; Grand multiparity; and Hyperthyroidism affecting pregnancy on their problem list.  Patient reports no complaints.  Contractions: Not present. Vag. Bleeding: None.  Movement: Present. Denies leaking of fluid.   The following portions of the patient's history were reviewed and updated as appropriate: allergies, current medications, past family history, past medical history, past social history, past surgical history and problem list. Problem list updated.  Objective:   Vitals:   09/05/18 1115  BP: 109/69  Pulse: (!) 103  Weight: 182 lb 8 oz (82.8 kg)    Fetal Status: Fetal Heart Rate (bpm): 162 Fundal Height: 33 cm Movement: Present     General:  Alert, oriented and cooperative. Patient is in no acute distress.  Skin: Skin is warm and dry. No rash noted.   Cardiovascular: Normal heart rate noted  Respiratory: Normal respiratory effort, no problems with respiration noted  Abdomen: Soft, gravid, appropriate for gestational age.  Pain/Pressure: Absent     Pelvic: Cervical exam deferred        Extremities: Normal range of motion.  Edema: None  Mental Status: Normal mood and affect. Normal behavior. Normal judgment and thought content.   Assessment and Plan:  Pregnancy: G6P5005 at 7763w2d  1. Supervision of other normal pregnancy, antepartum Patient is doing well without complaints Normal ultrasound last week TSH today  2. Grand multiparity Patient plans IUD for contraception  3. Late prenatal care affecting pregnancy, antepartum Onset of care at 18 weeks  Preterm labor symptoms and general obstetric precautions including but not limited to vaginal bleeding, contractions,  leaking of fluid and fetal movement were reviewed in detail with the patient. Please refer to After Visit Summary for other counseling recommendations.  Return in about 2 weeks (around 09/19/2018) for ROB.  No future appointments.  Catalina AntiguaPeggy Christl Fessenden, MD

## 2018-09-05 NOTE — Progress Notes (Signed)
Patient reports good fetal movement, denies pain. 

## 2018-09-06 LAB — TSH: TSH: 0.636 u[IU]/mL (ref 0.450–4.500)

## 2018-09-20 ENCOUNTER — Encounter: Payer: Self-pay | Admitting: Obstetrics & Gynecology

## 2018-09-20 ENCOUNTER — Ambulatory Visit (INDEPENDENT_AMBULATORY_CARE_PROVIDER_SITE_OTHER): Payer: Medicaid Other | Admitting: Obstetrics & Gynecology

## 2018-09-20 VITALS — BP 113/74 | HR 94 | Wt 187.9 lb

## 2018-09-20 DIAGNOSIS — Z641 Problems related to multiparity: Secondary | ICD-10-CM

## 2018-09-20 DIAGNOSIS — Z3480 Encounter for supervision of other normal pregnancy, unspecified trimester: Secondary | ICD-10-CM

## 2018-09-20 DIAGNOSIS — Z3483 Encounter for supervision of other normal pregnancy, third trimester: Secondary | ICD-10-CM

## 2018-09-20 NOTE — Patient Instructions (Signed)
Vaginal Delivery Vaginal delivery means that you will give birth by pushing your baby out of your birth canal (vagina). A team of health care providers will help you before, during, and after vaginal delivery. Birth experiences are unique for every woman and every pregnancy, and birth experiences vary depending on where you choose to give birth. What should I do to prepare for my baby's birth? Before your baby is born, it is important to talk with your health care provider about:  Your labor and delivery preferences. These may include: ? Medicines that you may be given. ? How you will manage your pain. This might include non-medical pain relief techniques or injectable pain relief such as epidural analgesia. ? How you and your baby will be monitored during labor and delivery. ? Who may be in the labor and delivery room with you. ? Your feelings about surgical delivery of your baby (cesarean delivery, or C-section) if this becomes necessary. ? Your feelings about receiving donated blood through an IV tube (blood transfusion) if this becomes necessary.  Whether you are able: ? To take pictures or videos of the birth. ? To eat during labor and delivery. ? To move around, walk, or change positions during labor and delivery.  What to expect after your baby is born, such as: ? Whether delayed umbilical cord clamping and cutting is offered. ? Who will care for your baby right after birth. ? Medicines or tests that may be recommended for your baby. ? Whether breastfeeding is supported in your hospital or birth center. ? How long you will be in the hospital or birth center.  How any medical conditions you have may affect your baby or your labor and delivery experience.  To prepare for your baby's birth, you should also:  Attend all of your health care visits before delivery (prenatal visits) as recommended by your health care provider. This is important.  Prepare your home for your baby's  arrival. Make sure that you have: ? Diapers. ? Baby clothing. ? Feeding equipment. ? Safe sleeping arrangements for you and your baby.  Install a car seat in your vehicle. Have your car seat checked by a certified car seat installer to make sure that it is installed safely.  Think about who will help you with your new baby at home for at least the first several weeks after delivery.  What can I expect when I arrive at the birth center or hospital? Once you are in labor and have been admitted into the hospital or birth center, your health care provider may:  Review your pregnancy history and any concerns you have.  Insert an IV tube into one of your veins. This is used to give you fluids and medicines.  Check your blood pressure, pulse, temperature, and heart rate (vital signs).  Check whether your bag of water (amniotic sac) has broken (ruptured).  Talk with you about your birth plan and discuss pain control options.  Monitoring Your health care provider may monitor your contractions (uterine monitoring) and your baby's heart rate (fetal monitoring). You may need to be monitored:  Often, but not continuously (intermittently).  All the time or for long periods at a time (continuously). Continuous monitoring may be needed if: ? You are taking certain medicines, such as medicine to relieve pain or make your contractions stronger. ? You have pregnancy or labor complications.  Monitoring may be done by:  Placing a special stethoscope or a handheld monitoring device on your abdomen to   check your baby's heartbeat, and feeling your abdomen for contractions. This method of monitoring does not continuously record your baby's heartbeat or your contractions.  Placing monitors on your abdomen (external monitors) to record your baby's heartbeat and the frequency and length of contractions. You may not have to wear external monitors all the time.  Placing monitors inside of your uterus  (internal monitors) to record your baby's heartbeat and the frequency, length, and strength of your contractions. ? Your health care provider may use internal monitors if he or she needs more information about the strength of your contractions or your baby's heart rate. ? Internal monitors are put in place by passing a thin, flexible wire through your vagina and into your uterus. Depending on the type of monitor, it may remain in your uterus or on your baby's head until birth. ? Your health care provider will discuss the benefits and risks of internal monitoring with you and will ask for your permission before inserting the monitors.  Telemetry. This is a type of continuous monitoring that can be done with external or internal monitors. Instead of having to stay in bed, you are able to move around during telemetry. Ask your health care provider if telemetry is an option for you.  Physical exam Your health care provider may perform a physical exam. This may include:  Checking whether your baby is positioned: ? With the head toward your vagina (head-down). This is most common. ? With the head toward the top of your uterus (head-up or breech). If your baby is in a breech position, your health care provider may try to turn your baby to a head-down position so you can deliver vaginally. If it does not seem that your baby can be born vaginally, your provider may recommend surgery to deliver your baby. In rare cases, you may be able to deliver vaginally if your baby is head-up (breech delivery). ? Lying sideways (transverse). Babies that are lying sideways cannot be delivered vaginally.  Checking your cervix to determine: ? Whether it is thinning out (effacing). ? Whether it is opening up (dilating). ? How low your baby has moved into your birth canal.  What are the three stages of labor and delivery?  Normal labor and delivery is divided into the following three stages: Stage 1  Stage 1 is the  longest stage of labor, and it can last for hours or days. Stage 1 includes: ? Early labor. This is when contractions may be irregular, or regular and mild. Generally, early labor contractions are more than 10 minutes apart. ? Active labor. This is when contractions get longer, more regular, more frequent, and more intense. ? The transition phase. This is when contractions happen very close together, are very intense, and may last longer than during any other part of labor.  Contractions generally feel mild, infrequent, and irregular at first. They get stronger, more frequent (about every 2-3 minutes), and more regular as you progress from early labor through active labor and transition.  Many women progress through stage 1 naturally, but you may need help to continue making progress. If this happens, your health care provider may talk with you about: ? Rupturing your amniotic sac if it has not ruptured yet. ? Giving you medicine to help make your contractions stronger and more frequent.  Stage 1 ends when your cervix is completely dilated to 4 inches (10 cm) and completely effaced. This happens at the end of the transition phase. Stage 2  Once   your cervix is completely effaced and dilated to 4 inches (10 cm), you may start to feel an urge to push. It is common for the body to naturally take a rest before feeling the urge to push, especially if you received an epidural or certain other pain medicines. This rest period may last for up to 1-2 hours, depending on your unique labor experience.  During stage 2, contractions are generally less painful, because pushing helps relieve contraction pain. Instead of contraction pain, you may feel stretching and burning pain, especially when the widest part of your baby's head passes through the vaginal opening (crowning).  Your health care provider will closely monitor your pushing progress and your baby's progress through the vagina during stage 2.  Your  health care provider may massage the area of skin between your vaginal opening and anus (perineum) or apply warm compresses to your perineum. This helps it stretch as the baby's head starts to crown, which can help prevent perineal tearing. ? In some cases, an incision may be made in your perineum (episiotomy) to allow the baby to pass through the vaginal opening. An episiotomy helps to make the opening of the vagina larger to allow more room for the baby to fit through.  It is very important to breathe and focus so your health care provider can control the delivery of your baby's head. Your health care provider may have you decrease the intensity of your pushing, to help prevent perineal tearing.  After delivery of your baby's head, the shoulders and the rest of the body generally deliver very quickly and without difficulty.  Once your baby is delivered, the umbilical cord may be cut right away, or this may be delayed for 1-2 minutes, depending on your baby's health. This may vary among health care providers, hospitals, and birth centers.  If you and your baby are healthy enough, your baby may be placed on your chest or abdomen to help maintain the baby's temperature and to help you bond with each other. Some mothers and babies start breastfeeding at this time. Your health care team will dry your baby and help keep your baby warm during this time.  Your baby may need immediate care if he or she: ? Showed signs of distress during labor. ? Has a medical condition. ? Was born too early (prematurely). ? Had a bowel movement before birth (meconium). ? Shows signs of difficulty transitioning from being inside the uterus to being outside of the uterus. If you are planning to breastfeed, your health care team will help you begin a feeding. Stage 3  The third stage of labor starts immediately after the birth of your baby and ends after you deliver the placenta. The placenta is an organ that develops  during pregnancy to provide oxygen and nutrients to your baby in the womb.  Delivering the placenta may require some pushing, and you may have mild contractions. Breastfeeding can stimulate contractions to help you deliver the placenta.  After the placenta is delivered, your uterus should tighten (contract) and become firm. This helps to stop bleeding in your uterus. To help your uterus contract and to control bleeding, your health care provider may: ? Give you medicine by injection, through an IV tube, by mouth, or through your rectum (rectally). ? Massage your abdomen or perform a vaginal exam to remove any blood clots that are left in your uterus. ? Empty your bladder by placing a thin, flexible tube (catheter) into your bladder. ? Encourage   you to breastfeed your baby. After labor is over, you and your baby will be monitored closely to ensure that you are both healthy until you are ready to go home. Your health care team will teach you how to care for yourself and your baby. This information is not intended to replace advice given to you by your health care provider. Make sure you discuss any questions you have with your health care provider. Document Released: 07/14/2008 Document Revised: 04/24/2016 Document Reviewed: 10/20/2015 Elsevier Interactive Patient Education  2018 [employer]  

## 2018-09-20 NOTE — Progress Notes (Signed)
Pt is here for ROB. G6P5 2927w3d.

## 2018-09-20 NOTE — Progress Notes (Signed)
   PRENATAL VISIT NOTE  Subjective:  Martha GroutJazmin Macias is a 27 y.o. G6P5005 at 8668w0d being seen today for ongoing prenatal care.  She is currently monitored for the following issues for this low-risk pregnancy and has Supervision of other normal pregnancy, antepartum; Late prenatal care affecting pregnancy, antepartum; Grand multiparity; and Hyperthyroidism affecting pregnancy on their problem list.  Patient reports no complaints.  Contractions: Not present. Vag. Bleeding: None.  Movement: Present. Denies leaking of fluid.   The following portions of the patient's history were reviewed and updated as appropriate: allergies, current medications, past family history, past medical history, past social history, past surgical history and problem list. Problem list updated.  Objective:   Vitals:   09/20/18 1040  BP: 113/74  Pulse: 94  Weight: 187 lb 14.4 oz (85.2 kg)    Fetal Status: Fetal Heart Rate (bpm): 150 Fundal Height: 36 cm Movement: Present     General:  Alert, oriented and cooperative. Patient is in no acute distress.  Skin: Skin is warm and dry. No rash noted.   Cardiovascular: Normal heart rate noted  Respiratory: Normal respiratory effort, no problems with respiration noted  Abdomen: Soft, gravid, appropriate for gestational age.  Pain/Pressure: Present     Pelvic: Cervical exam deferred        Extremities: Normal range of motion.  Edema: Trace  Mental Status: Normal mood and affect. Normal behavior. Normal judgment and thought content.   Assessment and Plan:  Pregnancy: G6P5005 at 5468w0d  1. Supervision of other normal pregnancy, antepartum Recalculated EDC due to error recording LMP, US in agreement  2. Grand multiparity rapid labor  Preterm labor symptoms and general obstetric precautions including but not limited to vaginal bleeding, contractions, leaking of fluid and fetal movement were reviewed in detail with the patient. Please refer to After Visit Summary for other  counseling recommendations.  Return in about 1 week (around 09/27/2018).  No future appointments.  Scheryl DarterJames Jadd Gasior, MD

## 2018-09-27 ENCOUNTER — Encounter: Payer: Self-pay | Admitting: Obstetrics and Gynecology

## 2018-09-27 ENCOUNTER — Other Ambulatory Visit (HOSPITAL_COMMUNITY)
Admission: RE | Admit: 2018-09-27 | Discharge: 2018-09-27 | Disposition: A | Discharge status: Home / Self Care | Payer: Medicaid Other | Source: Ambulatory Visit | Attending: Obstetrics and Gynecology | Admitting: Obstetrics and Gynecology

## 2018-09-27 ENCOUNTER — Ambulatory Visit (INDEPENDENT_AMBULATORY_CARE_PROVIDER_SITE_OTHER): Payer: Medicaid Other | Admitting: Obstetrics and Gynecology

## 2018-09-27 VITALS — BP 115/70 | HR 90 | Wt 185.6 lb

## 2018-09-27 DIAGNOSIS — Z3483 Encounter for supervision of other normal pregnancy, third trimester: Secondary | ICD-10-CM

## 2018-09-27 DIAGNOSIS — Z3480 Encounter for supervision of other normal pregnancy, unspecified trimester: Secondary | ICD-10-CM | POA: Insufficient documentation

## 2018-09-27 LAB — OB RESULTS CONSOLE GBS: GBS: NEGATIVE

## 2018-09-27 LAB — OB RESULTS CONSOLE GC/CHLAMYDIA: Gonorrhea: NEGATIVE

## 2018-09-27 NOTE — Progress Notes (Signed)
Prenatal Visit Note Date: 09/27/2018 Clinic: [facility]  Subjective:  Martha Macias is a [age] y.o. G6P5005 at 653w0d being seen today for ongoing prenatal care.  She is currently monitored for the following issues for this low-risk pregnancy and has Supervision of other normal pregnancy, antepartum; Late prenatal care affecting pregnancy, antepartum; Grand multiparity; and Hyperthyroidism affecting pregnancy on their problem list.  Patient reports thinks baby may have flipped.   Contractions: Irregular. Vag. Bleeding: None.  Movement: Present. Denies leaking of fluid.   The following portions of the patient's history were reviewed and updated as appropriate: allergies, current medications, past family history, past medical history, past social history, past surgical history and problem list. Problem list updated.  Objective:   Vitals:   09/27/18 1042  BP: 115/70  Pulse: 90  Weight: 185 lb 9.6 oz (84.2 kg)    Fetal Status: Fetal Heart Rate (bpm): 154 Fundal Height: 37 cm Movement: Present  Presentation: Vertex  General:  Alert, oriented and cooperative. Patient is in no acute distress.  Skin: Skin is warm and dry. No rash noted.   Cardiovascular: Normal heart rate noted  Respiratory: Normal respiratory effort, no problems with respiration noted  Abdomen: Soft, gravid, appropriate for gestational age. Pain/Pressure: Present     Pelvic:  Cervical exam performed Dilation: 1 Effacement (%): 50 Station: -3  Extremities: Normal range of motion.  Edema: Trace  Mental Status: Normal mood and affect. Normal behavior. Normal judgment and thought content.   Urinalysis:      Assessment and Plan:  Pregnancy: G6P5005 at 1853w0d  1. Supervision of other normal pregnancy, antepartum ceph on bedside u/s. mirena - Culture, beta strep (group b only) - Cervicovaginal ancillary only( [place])  Term labor symptoms and general obstetric precautions including but not limited to vaginal bleeding,  contractions, leaking of fluid and fetal movement were reviewed in detail with the patient. Please refer to After Visit Summary for other counseling recommendations.  Return in about 1 week (around 10/04/2018) for 7-10d rob.   Tamms BingPickens, Priscille Shadduck, MD

## 2018-09-28 LAB — CERVICOVAGINAL ANCILLARY ONLY
Chlamydia: NEGATIVE
NEISSERIA GONORRHEA: NEGATIVE

## 2018-10-01 LAB — CULTURE, BETA STREP (GROUP B ONLY): Strep Gp B Culture: NEGATIVE

## 2018-10-04 ENCOUNTER — Ambulatory Visit (INDEPENDENT_AMBULATORY_CARE_PROVIDER_SITE_OTHER): Payer: Medicaid Other | Admitting: Obstetrics

## 2018-10-04 ENCOUNTER — Encounter: Payer: Self-pay | Admitting: Obstetrics

## 2018-10-04 DIAGNOSIS — Z3483 Encounter for supervision of other normal pregnancy, third trimester: Secondary | ICD-10-CM

## 2018-10-04 DIAGNOSIS — Z3480 Encounter for supervision of other normal pregnancy, unspecified trimester: Secondary | ICD-10-CM

## 2018-10-04 NOTE — Progress Notes (Signed)
Subjective:  Martha Macias is a 27 y.o. G6P5005 at 836w0d being seen today for ongoing prenatal care.  She is currently monitored for the following issues for this low-risk pregnancy and has Supervision of other normal pregnancy, antepartum; Late prenatal care affecting pregnancy, antepartum; Grand multiparity; and Hyperthyroidism affecting pregnancy on their problem list.  Patient reports no complaints.  Contractions: Irregular. Vag. Bleeding: None.  Movement: Present. Denies leaking of fluid.   The following portions of the patient's history were reviewed and updated as appropriate: allergies, current medications, past family history, past medical history, past social history, past surgical history and problem list. Problem list updated.  Objective:   Vitals:   10/04/18 1015  BP: 115/72  Pulse: (!) 102  Weight: 185 lb 12.8 oz (84.3 kg)    Fetal Status:     Movement: Present     General:  Alert, oriented and cooperative. Patient is in no acute distress.  Skin: Skin is warm and dry. No rash noted.   Cardiovascular: Normal heart rate noted  Respiratory: Normal respiratory effort, no problems with respiration noted  Abdomen: Soft, gravid, appropriate for gestational age. Pain/Pressure: Present     Pelvic:  Cervical exam deferred        Extremities: Normal range of motion.  Edema: Trace  Mental Status: Normal mood and affect. Normal behavior. Normal judgment and thought content.   Urinalysis:      Assessment and Plan:  Pregnancy: G6P5005 at 5936w0d  1. Supervision of other normal pregnancy, antepartum   Term labor symptoms and general obstetric precautions including but not limited to vaginal bleeding, contractions, leaking of fluid and fetal movement were reviewed in detail with the patient. Please refer to After Visit Summary for other counseling recommendations.  Return in about 1 week (around 10/11/2018) for ROB.   Brock BadHarper, Chimaobi Casebolt A, MD

## 2018-10-13 ENCOUNTER — Encounter: Payer: Medicaid Other | Admitting: Obstetrics

## 2018-10-13 ENCOUNTER — Encounter: Payer: Self-pay | Admitting: Obstetrics

## 2018-10-13 ENCOUNTER — Ambulatory Visit (INDEPENDENT_AMBULATORY_CARE_PROVIDER_SITE_OTHER): Payer: Medicaid Other | Admitting: Obstetrics

## 2018-10-13 VITALS — BP 125/78 | HR 101 | Wt 190.0 lb

## 2018-10-13 DIAGNOSIS — Z3480 Encounter for supervision of other normal pregnancy, unspecified trimester: Secondary | ICD-10-CM

## 2018-10-13 DIAGNOSIS — Z3483 Encounter for supervision of other normal pregnancy, third trimester: Secondary | ICD-10-CM

## 2018-10-13 NOTE — Progress Notes (Signed)
Subjective:  Martha GroutJazmin Macias is a 27 y.o. G6P5005 at 6559w2d being seen today for ongoing prenatal care.  She is currently monitored for the following issues for this low-risk pregnancy and has Supervision of other normal pregnancy, antepartum; Late prenatal care affecting pregnancy, antepartum; Grand multiparity; and Hyperthyroidism affecting pregnancy on their problem list.  Patient reports no complaints.  Contractions: Irritability. Vag. Bleeding: None.  Movement: Present. Denies leaking of fluid.   The following portions of the patient's history were reviewed and updated as appropriate: allergies, current medications, past family history, past medical history, past social history, past surgical history and problem list. Problem list updated.  Objective:   Vitals:   10/13/18 1333  BP: 125/78  Pulse: (!) 101  Weight: 190 lb (86.2 kg)    Fetal Status:     Movement: Present     General:  Alert, oriented and cooperative. Patient is in no acute distress.  Skin: Skin is warm and dry. No rash noted.   Cardiovascular: Normal heart rate noted  Respiratory: Normal respiratory effort, no problems with respiration noted  Abdomen: Soft, gravid, appropriate for gestational age. Pain/Pressure: Present     Pelvic:  Cervical exam performed      Cvx:  2 cm / 50 % / -3 / Vtx  Extremities: Normal range of motion.  Edema: None  Mental Status: Normal mood and affect. Normal behavior. Normal judgment and thought content.   Urinalysis:      Assessment and Plan:  Pregnancy: G6P5005 at 9059w2d  1. Supervision of other normal pregnancy, antepartum   Term labor symptoms and general obstetric precautions including but not limited to vaginal bleeding, contractions, leaking of fluid and fetal movement were reviewed in detail with the patient. Please refer to After Visit Summary for other counseling recommendations.  Return in about 1 week (around 10/20/2018) for ROB.   Brock BadHarper, Blong Busk A, MD

## 2018-10-13 NOTE — Progress Notes (Signed)
cervix check today.

## 2018-10-17 ENCOUNTER — Telehealth (HOSPITAL_COMMUNITY): Payer: Self-pay | Admitting: *Deleted

## 2018-10-17 NOTE — Telephone Encounter (Signed)
Preadmission screen  

## 2018-10-19 NOTE — L&D Delivery Note (Signed)
Patient: Oneal GroutJazmin Macias MRN: [id number]  GBS status: Negative, IAP given: None   Patient is a 28 y.o. now G6P6 s/p NSVD at 2679w0d, who was admitted for IOL for postdates. AROM 2h 9552m prior to delivery with clear fluid.    Delivery Note At 1:58 PM a viable female was delivered via Vaginal, Spontaneous (Presentation: ROA).  APGAR: 9, 9; weight 8 lb 1.1 oz (3660 g).   Placenta status:spontaneous, intact.  Cord:  3 v esselwith the following complications: none.  Short cord length noted. Cord pH: N/A  Anesthesia:  None  Episiotomy: None Lacerations: None Suture Repair: None  Est. Blood Loss (mL): 171  Head delivered ROA. No nuchal cord present. Shoulder and body delivered in usual fashion. Infant with spontaneous cry, placed on mother's abdomen, dried and bulb suctioned. Cord clamped x 2 after 1-minute delay, and cut by family member. Cord blood drawn. Placenta delivered spontaneously with gentle cord traction. Fundus firm with massage and Pitocin. Perineum inspected and found to have no lacerations.  Mom to postpartum.  Baby to Couplet care / Skin to Skin.  De HollingsheadCatherine L Toshika Parrow 10/25/2018, 7:04 PM

## 2018-10-20 ENCOUNTER — Other Ambulatory Visit: Payer: Medicaid Other

## 2018-10-20 ENCOUNTER — Ambulatory Visit (INDEPENDENT_AMBULATORY_CARE_PROVIDER_SITE_OTHER): Payer: Medicaid Other | Admitting: Obstetrics and Gynecology

## 2018-10-20 ENCOUNTER — Encounter: Payer: Self-pay | Admitting: Obstetrics and Gynecology

## 2018-10-20 VITALS — BP 111/73 | HR 86 | Wt 196.0 lb

## 2018-10-20 DIAGNOSIS — O480 Post-term pregnancy: Secondary | ICD-10-CM

## 2018-10-20 DIAGNOSIS — E0590 Thyrotoxicosis, unspecified without thyrotoxic crisis or storm: Secondary | ICD-10-CM

## 2018-10-20 DIAGNOSIS — Z641 Problems related to multiparity: Secondary | ICD-10-CM

## 2018-10-20 DIAGNOSIS — O99283 Endocrine, nutritional and metabolic diseases complicating pregnancy, third trimester: Secondary | ICD-10-CM

## 2018-10-20 DIAGNOSIS — Z3483 Encounter for supervision of other normal pregnancy, third trimester: Secondary | ICD-10-CM

## 2018-10-20 DIAGNOSIS — Z3480 Encounter for supervision of other normal pregnancy, unspecified trimester: Secondary | ICD-10-CM

## 2018-10-20 NOTE — Progress Notes (Signed)
   PRENATAL VISIT NOTE  Subjective:  Martha Macias is a 28 y.o. G6P5005 at [redacted]w[redacted]d being seen today for ongoing prenatal care.  She is currently monitored for the following issues for this high-risk pregnancy and has Supervision of other normal pregnancy, antepartum; Late prenatal care affecting pregnancy, antepartum; Grand multiparity; and Hyperthyroidism affecting pregnancy on their problem list.  Patient reports occasional contractions.  Contractions: Irritability. Vag. Bleeding: None.  Movement: Present. Denies leaking of fluid.   The following portions of the patient's history were reviewed and updated as appropriate: allergies, current medications, past family history, past medical history, past social history, past surgical history and problem list. Problem list updated.  Objective:   Vitals:   10/20/18 1440  BP: 111/73  Pulse: 86  Weight: 196 lb (88.9 kg)    Fetal Status:     Movement: Present     General:  Alert, oriented and cooperative. Patient is in no acute distress.  Skin: Skin is warm and dry. No rash noted.   Cardiovascular: Normal heart rate noted  Respiratory: Normal respiratory effort, no problems with respiration noted  Abdomen: Soft, gravid, appropriate for gestational age.  Pain/Pressure: Present     Pelvic: Cervical exam performed      2-3/50/-2  Extremities: Normal range of motion.  Edema: None  Mental Status: Normal mood and affect. Normal behavior. Normal judgment and thought content.   Assessment and Plan:  Pregnancy: G6P5005 at [redacted]w[redacted]d  1. Supervision of other normal pregnancy, antepartum IOL scheduled for 10/25/17, orders placed today NST today reactive AFI 19.57  2. Grand multiparity  3. Hyperthyroidism affecting pregnancy in third trimester   Term labor symptoms and general obstetric precautions including but not limited to vaginal bleeding, contractions, leaking of fluid and fetal movement were reviewed in detail with the patient. Please refer to  After Visit Summary for other counseling recommendations.  Return in about 5 weeks (around 11/24/2018) for post partum check.  Future Appointments  Date Time Provider Department Center  10/25/2018 12:00 AM [facility] ROOM [facility] None    Conan Bowens, MD

## 2018-10-21 ENCOUNTER — Encounter (HOSPITAL_COMMUNITY): Payer: Self-pay | Admitting: *Deleted

## 2018-10-21 ENCOUNTER — Telehealth (HOSPITAL_COMMUNITY): Payer: Self-pay | Admitting: *Deleted

## 2018-10-21 ENCOUNTER — Other Ambulatory Visit: Payer: Medicaid Other

## 2018-10-21 NOTE — Telephone Encounter (Signed)
Preadmission screen  

## 2018-10-25 ENCOUNTER — Inpatient Hospital Stay (HOSPITAL_COMMUNITY)
Admission: RE | Admit: 2018-10-25 | Discharge: 2018-10-26 | DRG: 807 | Disposition: A | Discharge status: Home / Self Care | Payer: Medicaid Other | Attending: Obstetrics & Gynecology | Admitting: Obstetrics & Gynecology

## 2018-10-25 ENCOUNTER — Encounter (HOSPITAL_COMMUNITY): Payer: Self-pay

## 2018-10-25 ENCOUNTER — Other Ambulatory Visit: Payer: Self-pay

## 2018-10-25 DIAGNOSIS — Z641 Problems related to multiparity: Secondary | ICD-10-CM

## 2018-10-25 DIAGNOSIS — Z3A41 41 weeks gestation of pregnancy: Secondary | ICD-10-CM

## 2018-10-25 DIAGNOSIS — O0930 Supervision of pregnancy with insufficient antenatal care, unspecified trimester: Secondary | ICD-10-CM

## 2018-10-25 DIAGNOSIS — O480 Post-term pregnancy: Secondary | ICD-10-CM | POA: Diagnosis present

## 2018-10-25 LAB — TYPE AND SCREEN
ABO/RH(D): O POS
Antibody Screen: NEGATIVE

## 2018-10-25 LAB — ABO/RH: ABO/RH(D): O POS

## 2018-10-25 LAB — CBC
HCT: 29.5 % — ABNORMAL LOW (ref 36.0–46.0)
Hemoglobin: 9.5 g/dL — ABNORMAL LOW (ref 12.0–15.0)
MCH: 27.5 pg (ref 26.0–34.0)
MCHC: 32.2 g/dL (ref 30.0–36.0)
MCV: 85.5 fL (ref 80.0–100.0)
PLATELETS: 252 10*3/uL (ref 150–400)
RBC: 3.45 MIL/uL — ABNORMAL LOW (ref 3.87–5.11)
RDW: 13.2 % (ref 11.5–15.5)
WBC: 7.3 10*3/uL (ref 4.0–10.5)
nRBC: 0 % (ref 0.0–0.2)

## 2018-10-25 LAB — RPR: RPR Ser Ql: NONREACTIVE

## 2018-10-25 MED ORDER — OXYTOCIN BOLUS FROM INFUSION
500.0000 mL | Freq: Once | INTRAVENOUS | Status: AC
  Administered 2018-10-25: 500 mL via INTRAVENOUS

## 2018-10-25 MED ORDER — MISOPROSTOL 50MCG HALF TABLET
50.0000 ug | ORAL_TABLET | ORAL | Status: DC | PRN
  Administered 2018-10-25: 50 ug via BUCCAL
  Filled 2018-10-25 (×2): qty 1

## 2018-10-25 MED ORDER — OXYTOCIN 40 UNITS IN NORMAL SALINE INFUSION - SIMPLE MED
2.5000 [IU]/h | INTRAVENOUS | Status: DC
  Administered 2018-10-25: 2.5 [IU]/h via INTRAVENOUS
  Filled 2018-10-25: qty 1000

## 2018-10-25 MED ORDER — DIBUCAINE 1 % RE OINT: 1.0000 "application " | TOPICAL_OINTMENT | RECTAL | Status: DC | PRN

## 2018-10-25 MED ORDER — WITCH HAZEL-GLYCERIN EX PADS: 1.0000 "application " | MEDICATED_PAD | CUTANEOUS | Status: DC | PRN

## 2018-10-25 MED ORDER — ACETAMINOPHEN 325 MG PO TABS: 650.0000 mg | ORAL_TABLET | ORAL | Status: DC | PRN

## 2018-10-25 MED ORDER — FENTANYL CITRATE (PF) 100 MCG/2ML IJ SOLN
100.0000 ug | INTRAMUSCULAR | Status: DC | PRN
  Administered 2018-10-25: 100 ug via INTRAVENOUS
  Filled 2018-10-25: qty 2

## 2018-10-25 MED ORDER — LACTATED RINGERS IV SOLN: 500.0000 mL | INTRAVENOUS | Status: DC | PRN

## 2018-10-25 MED ORDER — COCONUT OIL OIL: 1.0000 "application " | TOPICAL_OIL | Status: DC | PRN

## 2018-10-25 MED ORDER — TERBUTALINE SULFATE 1 MG/ML IJ SOLN
0.2500 mg | Freq: Once | INTRAMUSCULAR | Status: DC | PRN
  Filled 2018-10-25: qty 1

## 2018-10-25 MED ORDER — ONDANSETRON HCL 4 MG/2ML IJ SOLN: 4.0000 mg | INTRAMUSCULAR | Status: DC | PRN

## 2018-10-25 MED ORDER — ONDANSETRON HCL 4 MG/2ML IJ SOLN: 4.0000 mg | Freq: Four times a day (QID) | INTRAMUSCULAR | Status: DC | PRN

## 2018-10-25 MED ORDER — ONDANSETRON HCL 4 MG PO TABS: 4.0000 mg | ORAL_TABLET | ORAL | Status: DC | PRN

## 2018-10-25 MED ORDER — IBUPROFEN 600 MG PO TABS
600.0000 mg | ORAL_TABLET | Freq: Four times a day (QID) | ORAL | Status: DC
  Administered 2018-10-25 – 2018-10-26 (×5): 600 mg via ORAL
  Filled 2018-10-25 (×5): qty 1

## 2018-10-25 MED ORDER — BENZOCAINE-MENTHOL 20-0.5 % EX AERO: 1.0000 "application " | INHALATION_SPRAY | CUTANEOUS | Status: DC | PRN

## 2018-10-25 MED ORDER — LIDOCAINE HCL (PF) 1 % IJ SOLN
30.0000 mL | INTRAMUSCULAR | Status: DC | PRN
  Filled 2018-10-25: qty 30

## 2018-10-25 MED ORDER — TETANUS-DIPHTH-ACELL PERTUSSIS 5-2.5-18.5 LF-MCG/0.5 IM SUSP: 0.5000 mL | Freq: Once | INTRAMUSCULAR | Status: DC

## 2018-10-25 MED ORDER — MISOPROSTOL 25 MCG QUARTER TABLET
25.0000 ug | ORAL_TABLET | ORAL | Status: DC | PRN
  Administered 2018-10-25: 25 ug via VAGINAL
  Filled 2018-10-25: qty 1

## 2018-10-25 MED ORDER — MEASLES, MUMPS & RUBELLA VAC IJ SOLR: 0.5000 mL | Freq: Once | INTRAMUSCULAR | Status: DC

## 2018-10-25 MED ORDER — OXYTOCIN 40 UNITS IN LACTATED RINGERS INFUSION - SIMPLE MED: 2.5000 [IU]/h | INTRAVENOUS | Status: DC

## 2018-10-25 MED ORDER — SENNOSIDES-DOCUSATE SODIUM 8.6-50 MG PO TABS
2.0000 | ORAL_TABLET | ORAL | Status: DC
  Administered 2018-10-25: 2 via ORAL
  Filled 2018-10-25: qty 2

## 2018-10-25 MED ORDER — PRENATAL MULTIVITAMIN CH
1.0000 | ORAL_TABLET | Freq: Every day | ORAL | Status: DC
  Administered 2018-10-26: 1 via ORAL
  Filled 2018-10-25: qty 1

## 2018-10-25 MED ORDER — ACETAMINOPHEN 325 MG PO TABS
650.0000 mg | ORAL_TABLET | ORAL | Status: DC | PRN
  Administered 2018-10-25: 650 mg via ORAL
  Filled 2018-10-25: qty 2

## 2018-10-25 MED ORDER — ZOLPIDEM TARTRATE 5 MG PO TABS: 5.0000 mg | ORAL_TABLET | Freq: Every evening | ORAL | Status: DC | PRN

## 2018-10-25 MED ORDER — SOD CITRATE-CITRIC ACID 500-334 MG/5ML PO SOLN: 30.0000 mL | ORAL | Status: DC | PRN

## 2018-10-25 MED ORDER — SIMETHICONE 80 MG PO CHEW: 80.0000 mg | CHEWABLE_TABLET | ORAL | Status: DC | PRN

## 2018-10-25 MED ORDER — OXYTOCIN BOLUS FROM INFUSION: 500.0000 mL | Freq: Once | INTRAVENOUS | Status: DC

## 2018-10-25 MED ORDER — DIPHENHYDRAMINE HCL 25 MG PO CAPS: 25.0000 mg | ORAL_CAPSULE | Freq: Four times a day (QID) | ORAL | Status: DC | PRN

## 2018-10-25 MED ORDER — LACTATED RINGERS IV SOLN
INTRAVENOUS | Status: DC
  Administered 2018-10-25 (×2): via INTRAVENOUS

## 2018-10-25 NOTE — Anesthesia Pain Management Evaluation Note (Signed)
  [facility] Pain Management Visit Note  Patient: Martha Macias, 28 y.o., female  "Hello I am a member of the anesthesia team at [facility]. We have an anesthesia team available at all times to provide care throughout the hospital, including epidural management and anesthesia for C-section. I don't know your plan for the delivery whether it a natural birth, water birth, IV sedation, nitrous supplementation, doula or epidural, but we want to meet your pain goals."   1.Was your pain managed to your expectations on prior hospitalizations?   Yes   2.What is your expectation for pain management during this hospitalization?     Labor support without medications  3.How can we help you reach that goal? Pt desires natural childbirth  Record the patient's initial score and the patient's pain goal.   Pain: 7  Pain Goal: 10 [facility] wants you to be able to say your pain was always managed very well.  Martha Macias 10/25/2018

## 2018-10-25 NOTE — Progress Notes (Signed)
OB/GYN Faculty Practice: Labor Progress Note  Subjective: Patient sleeping. Discussed plan of care with RN.   Objective: BP 123/76   Pulse 72   Temp 98.2 F (36.8 C) (Oral)   Resp 16   Ht 5\' 9"  (1.753 m)   Wt 88.1 kg   LMP 01/11/2018   BMI 28.69 kg/m  Gen: sleeping Dilation: 2.5 Effacement (%): 50 Cervical Position: Posterior Station: -3 Presentation: Vertex Exam by:: Cyprus McHenry RN  Assessment and Plan: 28 y.o. [id number] [redacted]w[redacted]d here for PDIOL.  Labor: Induction started around midnight with vaginal cytotec. Will switch to buccal and consider FB with next check.  -- pain control: sleeping now, does not want epidural  -- PPH Risk: medium (grand multip)  Fetal Well-Being: EFW 46% at 32w6. Cephalic by prior RN check.  -- Category I - continuous fetal monitoring  -- GBS negative    Laurel S. Earlene Plater, DO OB/GYN Fellow, Faculty Practice  4:43 AM

## 2018-10-25 NOTE — Lactation Note (Signed)
This note was copied from a baby's chart. Lactation Consultation Note  Patient Name: Martha Macias XMIWO'E Date: 10/25/2018   Baby Martha Sharif born at 63w now 81 hours old. Mom is an experienced breastfeeder.  Mom reports breastfed with first 12 months, 2nd 12 months, 3rd 11 months, and 4th 9 months.  Mom reports the last child was a biter and she could not break him of it so weaned him.. Mom reports she feels they are breastfeeding well.  Mom denies need for assistance.  Urged mom to feed on cue and 8 or more times day.  Praised breastfeeding.  Urged mom to call lactation as needed.  Gave mom Understanding mother and baby booklet, cone breastfeeding consultation services booklet, and cone breastfeeding resource list.  Mom has 1 feeding log, gave her two more. Mom denies any challenges with breastfeeding but reports it was just harder than she thought it would be with her first. Mom with Hyperthyroidism but currently not on meds. Mom  reports levels wnl during pregnancy.  Urged mom to have levels checked PP exam and to have them checked if she experiences anything that coulld be related to hyperthyroidism. Praised breastfeeding.  Urged mom to call lactation as needed.    Feeding Feeding Type: Breast Fed  LATCH Score Latch: Grasps breast easily, tongue down, lips flanged, rhythmical sucking.  Audible Swallowing: Spontaneous and intermittent  Type of Nipple: Everted at rest and after stimulation  Comfort (Breast/Nipple): Soft / non-tender  Hold (Positioning): No assistance needed to correctly position infant at breast.  LATCH Score: 10  Interventions    Lactation Tools Discussed/Used     Consult Status      Neomia Dear 10/25/2018, 8:38 PM

## 2018-10-25 NOTE — Progress Notes (Signed)
LABOR PROGRESS NOTE  Martha Macias is a 28 y.o. B3P9432 at [redacted]w[redacted]d  admitted for PDIOL.   Subjective: Patient breathing through contractions, handling labor well.   Objective: BP 121/69   Pulse 72   Temp 98 F (36.7 C) (Oral)   Resp 18   Ht 5\' 9"  (1.753 m)   Wt 88.1 kg   LMP 01/11/2018   BMI 28.69 kg/m  or  Vitals:   10/25/18 0937 10/25/18 1033 10/25/18 1137 10/25/18 1142  BP: 122/63 128/63  121/69  Pulse: 78 68  72  Resp: 18 20  18   Temp:   98 F (36.7 C)   TempSrc:   Oral   Weight:      Height:        Dilation: 4 Effacement (%): 60 Cervical Position: Middle Station: -2 Presentation: Vertex Exam by:: Dr. Earlene Plater FHT: baseline rate 135, moderate varibility, +acel, no decel Toco: q2-3 min   Labs: Lab Results  Component Value Date   WBC 7.3 10/25/2018   HGB 9.5 (L) 10/25/2018   HCT 29.5 (L) 10/25/2018   MCV 85.5 10/25/2018   PLT 252 10/25/2018    Patient Active Problem List   Diagnosis Date Noted  . Post-dates pregnancy 10/25/2018  . Late prenatal care affecting pregnancy, antepartum 06/29/2018  . Grand multiparity 06/29/2018  . Hyperthyroidism affecting pregnancy 06/29/2018  . Supervision of other normal pregnancy, antepartum 05/25/2018    Assessment / Plan: 28 y.o. X6D4709 at [redacted]w[redacted]d here for PDIOL.   Labor: S/p cytotec x2. Patient wishes to avoid Pitocin. AROM at 1140 with clear fluid. Expect active labor with ROM due to grand multip and adequate contraction pattern.  Fetal Wellbeing:  Cat I  Pain Control:  Maternal support  Anticipated MOD:  NSVD  Marcy Siren, D.O. OB Fellow  10/25/2018, 12:27 PM

## 2018-10-25 NOTE — H&P (Addendum)
LABOR AND DELIVERY ADMISSION HISTORY AND PHYSICAL NOTE  Martha Macias is a 28 y.o. female [phone] with IUP at [redacted]w[redacted]d by LMP presenting for IOL for PD. She reports positive fetal movement. She denies leakage of fluid or vaginal bleeding.  Prenatal History/Complications: PNC at [facility] Pregnancy complications:  - Late Prenatal care - grand multiparity   Past Medical History: Past Medical History:  Diagnosis Date  . Anemia   . Dysrhythmia    tachycardia  . Heart murmur   . Hyperthyroidism   . Thyroid disease     Past Surgical History: History reviewed. No pertinent surgical history.  Obstetrical History: OB History    Gravida  6   Para  5   Term  5   Preterm      AB      Living  5     SAB      TAB      Ectopic      Multiple      Live Births  5           Social History: Social History   Socioeconomic History  . Marital status: Single    Spouse name: Not on file  . Number of children: Not on file  . Years of education: Not on file  . Highest education level: Not on file  Occupational History  . Not on file  Social Needs  . Financial resource strain: Not on file  . Food insecurity:    Worry: Not on file    Inability: Not on file  . Transportation needs:    Medical: Not on file    Non-medical: Not on file  Tobacco Use  . Smoking status: Never Smoker  . Smokeless tobacco: Never Used  Substance and Sexual Activity  . Alcohol use: Not Currently    Frequency: Never  . Drug use: Not Currently  . Sexual activity: Not Currently    Birth control/protection: None  Lifestyle  . Physical activity:    Days per week: Not on file    Minutes per session: Not on file  . Stress: Not on file  Relationships  . Social connections:    Talks on phone: Not on file    Gets together: Not on file    Attends religious service: Not on file    Active member of club or organization: Not on file    Attends meetings of clubs or organizations: Not on file   Relationship status: Not on file  Other Topics Concern  . Not on file  Social History Narrative  . Not on file    Family History: Family History  Problem Relation Age of Onset  . Diabetes type I Mother   . Heart disease Father     Allergies: Allergies  Allergen Reactions  . Pitocin [Oxytocin]     SOB    Medications Prior to Admission  Medication Sig Dispense Refill Last Dose  . Prenat-Fe Poly-Methfol-FA-DHA (VITAFOL ULTRA) 29-0.6-0.4-200 MG CAPS Take 1 tablet by mouth daily before breakfast. 90 capsule 4 Taking     Review of Systems  All systems reviewed and negative except as stated in HPI  Physical Exam Blood pressure 123/76, pulse 72, temperature 98.2 F (36.8 C), temperature source Oral, resp. rate 16, height 5\' 9"  (1.753 m), weight 88.1 kg, last menstrual period 01/11/2018. General appearance: alert, oriented, NAD Lungs: normal respiratory effort Heart: regular rate Abdomen: soft, non-tender; gravid, FH appropriate for GA Extremities: No calf swelling or tenderness Presentation: cephalic  Fetal monitoring: cat 1 Uterine activity: infrequent contractions Dilation: 2.5 Effacement (%): 50 Station: -3 Exam by:: Cyprus McHenry RN  Cervix: 2.5/50/-2  Prenatal labs: ABO, Rh: --/--/O POS, O POS Performed at [facility], [address], [place], [place] [address]  [phone]/07 0121) Antibody: NEG (01/07 0121) Rubella: 1.49 (08/07 1630) RPR: Non Reactive (11/01 1050)  HBsAg: Negative (08/07 1630)  HIV: Non Reactive (11/01 1050)  GC/Chlamydia: neg GBS: Negative (12/10 0000)  1hr/fasting/2-hr GTT: 78/101/98 Genetic screening:  neg Anatomy US: normal  Prenatal Transfer Tool  Maternal Diabetes: No Genetic Screening: Normal Maternal Ultrasounds/Referrals: Normal Fetal Ultrasounds or other Referrals:  None Maternal Substance Abuse:  No Significant Maternal Medications:  None Significant Maternal Lab Results: None  Results for orders placed or performed  during the hospital encounter of 10/25/18 (from the past 24 hour(s))  CBC   Collection Time: 10/25/18  1:21 AM  Result Value Ref Range   WBC 7.3 4.0 - 10.5 K/uL   RBC 3.45 (L) 3.87 - 5.11 MIL/uL   Hemoglobin 9.5 (L) 12.0 - 15.0 g/dL   HCT 15.6 (L) 15.3 - 79.4 %   MCV 85.5 80.0 - 100.0 fL   MCH 27.5 26.0 - 34.0 pg   MCHC 32.2 30.0 - 36.0 g/dL   RDW 32.7 61.4 - 70.9 %   Platelets 252 150 - 400 K/uL   nRBC 0.0 0.0 - 0.2 %  Type and screen   Collection Time: 10/25/18  1:21 AM  Result Value Ref Range   ABO/RH(D) O POS    Antibody Screen NEG    Sample Expiration      10/28/2018 Performed at [facility], [address], [place], [place] [address]   ABO/Rh   Collection Time: 10/25/18  1:21 AM  Result Value Ref Range   ABO/RH(D)      O POS Performed at [facility], [address], [place], [place] [address]     Patient Active Problem List   Diagnosis Date Noted  . Post-dates pregnancy 10/25/2018  . Late prenatal care affecting pregnancy, antepartum 06/29/2018  . Grand multiparity 06/29/2018  . Hyperthyroidism affecting pregnancy 06/29/2018  . Supervision of other normal pregnancy, antepartum 05/25/2018    Assessment: Martha Macias is a 28 y.o. [id number] at [redacted]w[redacted]d here for IOL for PD. Exam unchanged from clinic rougly 5 days ago. Will start with cytotec. Plan to place FB and consider switch to buccal cytotec with next check.   #Labor: cytotec #Pain: IV pain meds #FWB: Cat 1 #ID:  GBS neg #MOF: breast #MOC:IUD #Circ:  N/A  Myrene Buddy MD PGY-2 Family Medicine Resident 10/25/2018, 4:41 AM   Attestation: I have seen this patient and agree with the resident's documentation. I have examined them separately, and we have discussed the plan of care.  Cristal Deer. Earlene Plater, DO OB/GYN Fellow

## 2018-10-26 MED ORDER — IBUPROFEN 600 MG PO TABS: 600.0000 mg | ORAL_TABLET | Freq: Four times a day (QID) | ORAL | 0 refills | Status: AC

## 2018-10-26 NOTE — Discharge Summary (Addendum)
OB Discharge Summary     Patient Name: Martha GroutJazmin Cassetta DOB: 04/23/1991 MRN: [id number]  Date of admission: 10/25/2018 Delivering MD: Arvilla MarketWALLACE, CATHERINE LAUREN   Date of discharge: 10/26/2018  Admitting diagnosis: INDUCTION Intrauterine pregnancy: 5467w1d     Secondary diagnosis:  Active Problems:   Late prenatal care affecting pregnancy, antepartum   Grand multiparity   Post-dates pregnancy  Additional problems: none     Discharge diagnosis: postdates IOL                                                                                                 Post partum procedures:None  Augmentation: AROM and Cytotec  Complications: None  Hospital course:  Induction of Labor With Vaginal Delivery   28 y.o. yo [id number] at 2267w1d was admitted to the hospital 10/25/2018 for induction of labor.  Indication for induction: Postdates.  Patient had an uncomplicated labor course as follows:  Admitted on 1/7 as an IOL for PD. Patient was dilated to 2.5 at admission. Patient started on cytotec initially. She was AROMd a few hours later. Patient progressed to complete and delivered the baby without any complications. The post-delivery course was routine with no issuses.    Membrane Rupture Time/Date: 11:37 AM ,10/25/2018   Intrapartum Procedures: Episiotomy: None [1]                                         Lacerations:  None [1]  Patient had delivery of a Viable infant.  Information for the patient's newborn:  Martha Macias, Girl [name] [[id number]][[id number]]      10/25/2018  Details of delivery can be found in separate delivery note.  Patient had a routine postpartum course. Patient is discharged home 10/26/18.  Physical exam  Vitals:   10/25/18 1630 10/25/18 2030 10/26/18 0000 10/26/18 0430  BP: 112/67 (!) 105/48 (!) 115/57 (!) 123/57  Pulse: 73 72 66 70  Resp: 18 16 18 16   Temp: 97.7 F (36.5 C) 98.7 F (37.1 C) 98.4 F (36.9 C) 97.8 F (36.6 C)  TempSrc: Oral Oral Oral Oral  SpO2:  99% 100% 99%  Weight:       Height:       General: alert, cooperative and no distress Lochia: appropriate Uterine Fundus: firm Incision: Healing well with no significant drainage, No significant erythema, Dressing is clean, dry, and intact DVT Evaluation: No evidence of DVT seen on physical exam. Negative Homan's sign. No cords or calf tenderness. Labs: Lab Results  Component Value Date   WBC 7.3 10/25/2018   HGB 9.5 (L) 10/25/2018   HCT 29.5 (L) 10/25/2018   MCV 85.5 10/25/2018   PLT 252 10/25/2018   CMP Latest Ref Rng & Units 04/29/2018  Glucose 70 - 99 mg/dL 98  BUN 6 - 20 mg/dL 8  Creatinine 1.300.44 - 8.651.00 mg/dL 7.840.51  Sodium 696135 - 295145 mmol/L 137  Potassium 3.5 - 5.1 mmol/L 3.5  Chloride 98 - 111 mmol/L 107  CO2 22 - 32 mmol/L 23  Calcium 8.9 - 10.3 mg/dL 8.9  Total Protein 6.5 - 8.1 g/dL 6.6  Total Bilirubin 0.3 - 1.2 mg/dL 0.4  Alkaline Phos 38 - 126 U/L 56  AST 15 - 41 U/L 13(L)  ALT 0 - 44 U/L 10    Discharge instruction: per After Visit Summary and "Baby and Me Booklet".  After visit meds:  Allergies as of 10/26/2018      Reactions   Pitocin [oxytocin] Shortness Of Breath      Medication List    STOP taking these medications   VITAFOL ULTRA 29-0.6-0.4-200 MG Caps     TAKE these medications   ibuprofen 600 MG tablet Commonly known as:  ADVIL,MOTRIN Take 1 tablet (600 mg total) by mouth every 6 (six) hours.       Diet: routine diet  Activity: Advance as tolerated. Pelvic rest for 6 weeks.   Outpatient follow up:4 weeks Follow up Appt: Future Appointments  Date Time Provider Department Center  11/22/2018  9:00 AM Brock BadHarper, Charles A, MD [facility] None   Follow up Visit:No follow-ups on file.  Postpartum contraception: IUD Mirena  Newborn Data: Live born female  Birth Weight: 8 lb 1.1 oz (3660 g) APGAR: 9, 9  Newborn Delivery   Birth date/time:  10/25/2018 13:58:00 Delivery type:  Vaginal, Spontaneous     Baby Feeding: Breast Disposition:home with  mother   10/26/2018 Myrene BuddyJacob Fletcher, MD  OB FELLOW DISCHARGE ATTESTATION  I have seen and examined this patient and agree with above documentation in the resident's note.   Martha AbbotNimeka Ason Heslin, MD  OB Fellow  10/26/2018, 8:48 PM

## 2018-11-22 ENCOUNTER — Ambulatory Visit: Payer: Medicaid Other | Admitting: Obstetrics

## 2018-11-28 ENCOUNTER — Ambulatory Visit (INDEPENDENT_AMBULATORY_CARE_PROVIDER_SITE_OTHER): Payer: Medicaid Other | Admitting: Obstetrics

## 2018-11-28 ENCOUNTER — Encounter: Payer: Self-pay | Admitting: Obstetrics

## 2018-11-28 DIAGNOSIS — Z30011 Encounter for initial prescription of contraceptive pills: Secondary | ICD-10-CM

## 2018-11-28 DIAGNOSIS — Z1389 Encounter for screening for other disorder: Secondary | ICD-10-CM

## 2018-11-28 DIAGNOSIS — Z3009 Encounter for other general counseling and advice on contraception: Secondary | ICD-10-CM

## 2018-11-28 MED ORDER — NORETHINDRONE 0.35 MG PO TABS: 1.0000 | ORAL_TABLET | Freq: Every day | ORAL | 11 refills | Status: AC

## 2018-11-28 MED ORDER — LEVONORGEST-ETH ESTRADIOL-IRON 0.1-20 MG-MCG(21) PO TABS: 1.0000 | ORAL_TABLET | Freq: Every day | ORAL | 0 refills | Status: DC

## 2018-11-28 NOTE — Progress Notes (Signed)
Post Partum Exam  Martha Macias is a 28 y.o. G4P5005 female who presents for a postpartum visit. She is 4 weeks postpartum following a spontaneous vaginal delivery. I have fully reviewed the prenatal and intrapartum course. The delivery was at [redacted]wks gestational weeks.  Anesthesia: none. Postpartum course has been UNREMARKABLE. Baby's course has been UNREMARKABLE. Baby is feeding by breast. Bleeding no bleeding. Bowel function is normal. Bladder function is normal. Patient is not sexually active. Contraception method is none. Postpartum depression screening:neg  The following portions of the patient's history were reviewed and updated as appropriate: allergies, current medications, past family history, past medical history, past social history, past surgical history and problem list. Last pap smear done August 2019 and was Normal  Review of Systems A comprehensive review of systems was negative.    Objective:  Last menstrual period 01/11/2018.  PE:  Deferred   Assessment:   1. Postpartum care following vaginal delivery - doing well  2. Encounter for other general counseling and advice on contraception - wants IUD, but will take OCP's until she can get IUD insertion after 6 weeks  3. Encounter for initial prescription of contraceptive pills Rx: - norethindrone (MICRONOR,CAMILA,ERRIN) 0.35 MG tablet; Take 1 tablet (0.35 mg total) by mouth daily.  Dispense: 1 Package; Refill: 11   Plan:   1. Contraception: IUD 2. Continue PNV's 3. Follow up in: 4 weeks or as needed.    Brock Bad MD 11-28-2018

## 2018-12-26 ENCOUNTER — Ambulatory Visit: Payer: Medicaid Other | Admitting: Obstetrics

## 2019-03-30 IMAGING — US US MFM OB FOLLOW-UP
1 series · 14 of 28 positions shown · non-contrast
Comparison: none

[Series 1: us mfm ob follow-up · 14 of 44 slices shown]
[im 2/44]
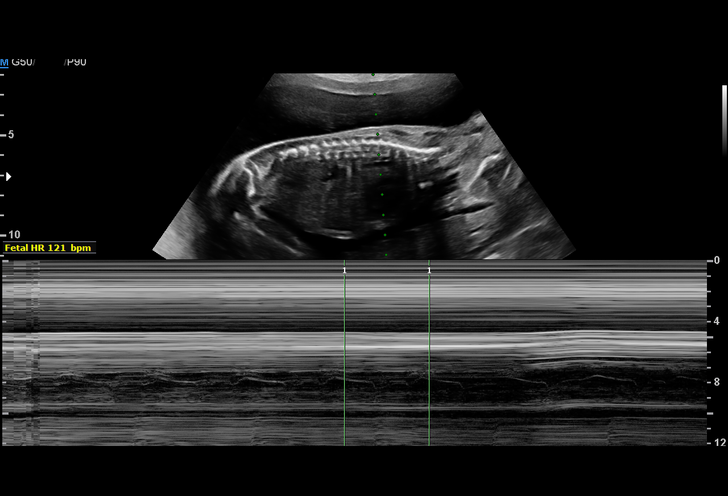
[im 5/44]
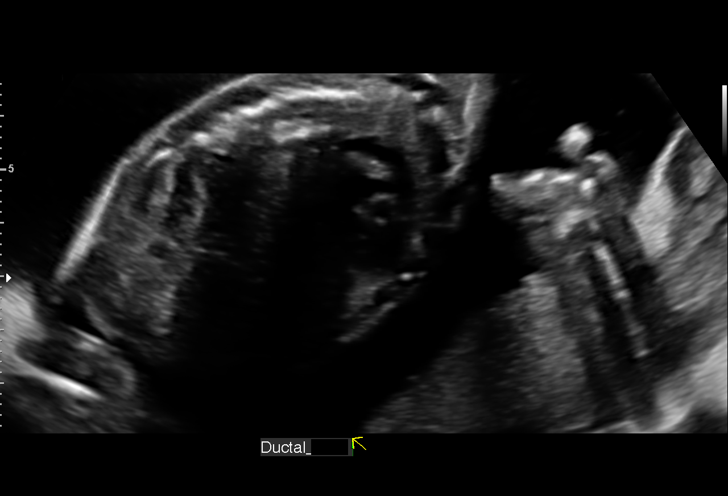
[im 8/44]
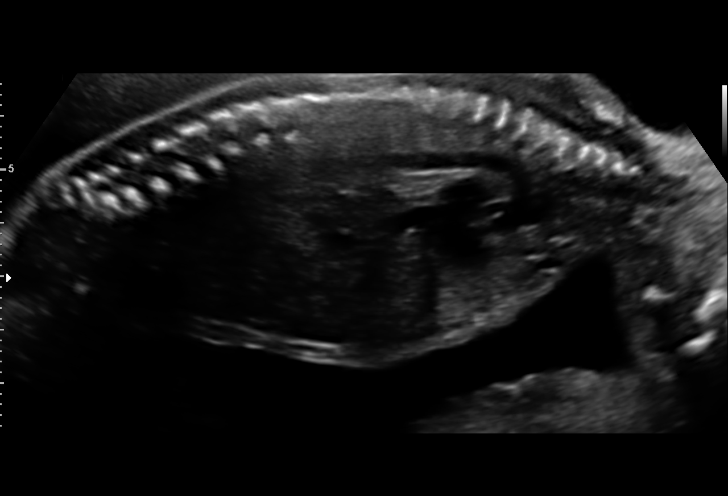
[im 12/44]
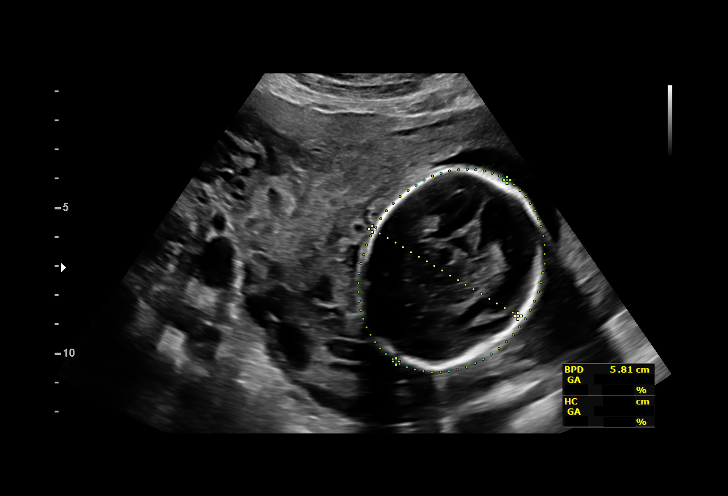
[im 15/44]
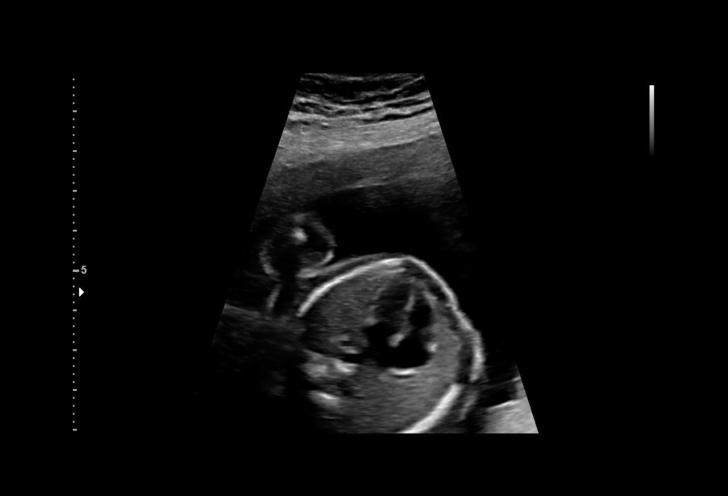
[im 18/44]
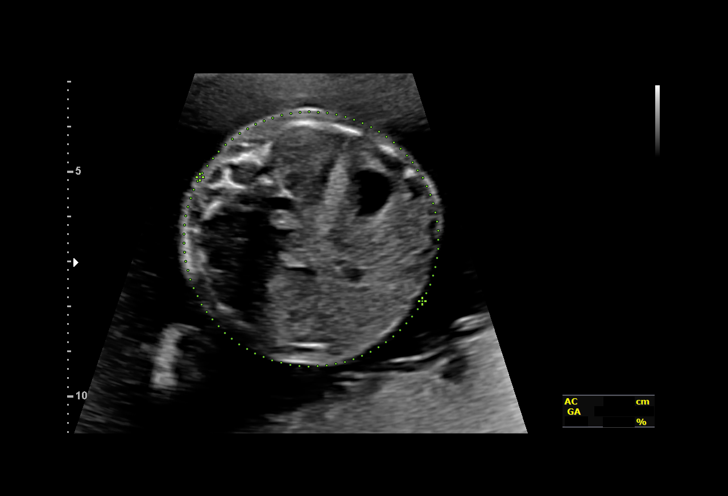
[im 21/44]
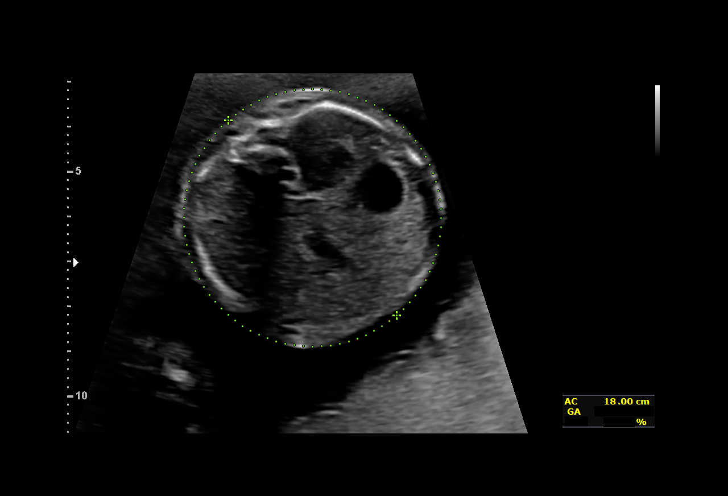
[im 24/44]
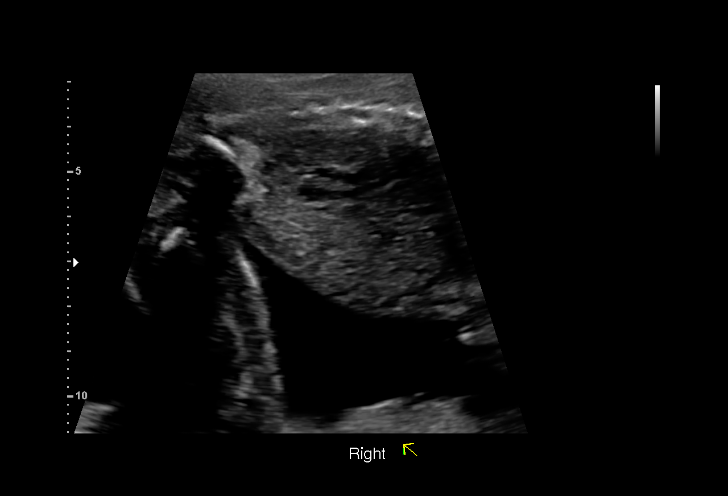
[im 28/44]
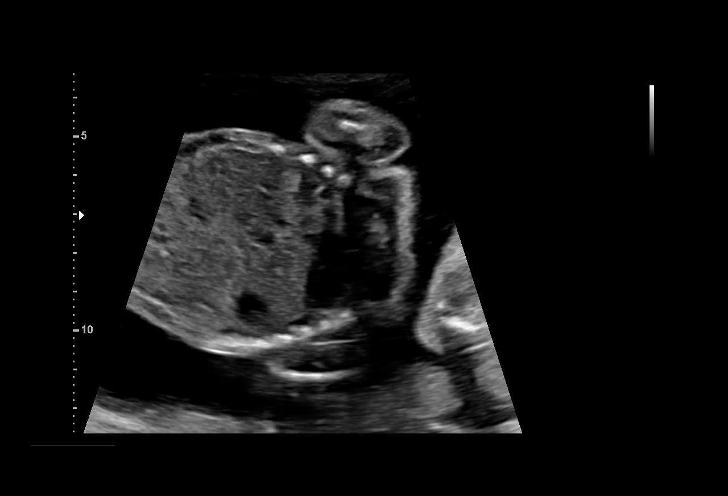
[im 31/44]
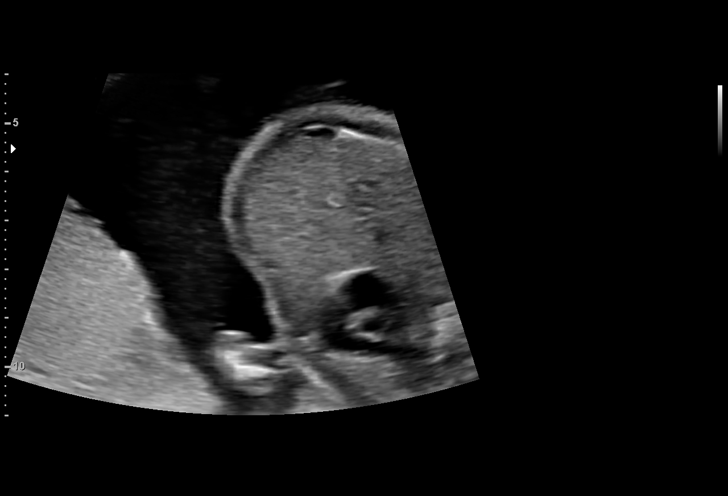
[im 34/44]
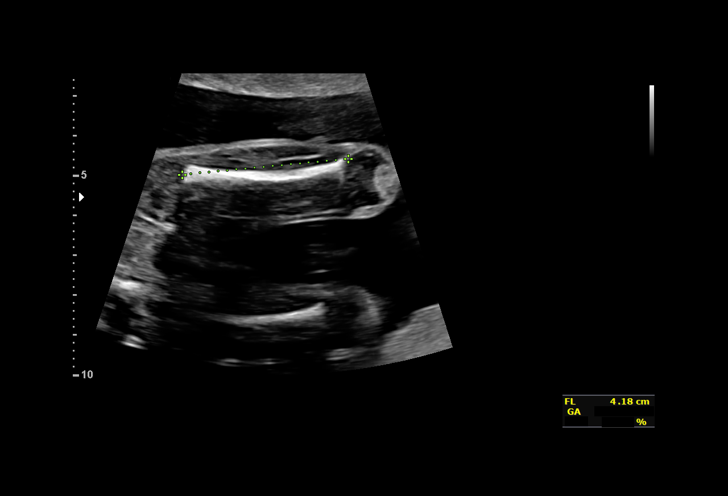
[im 37/44]
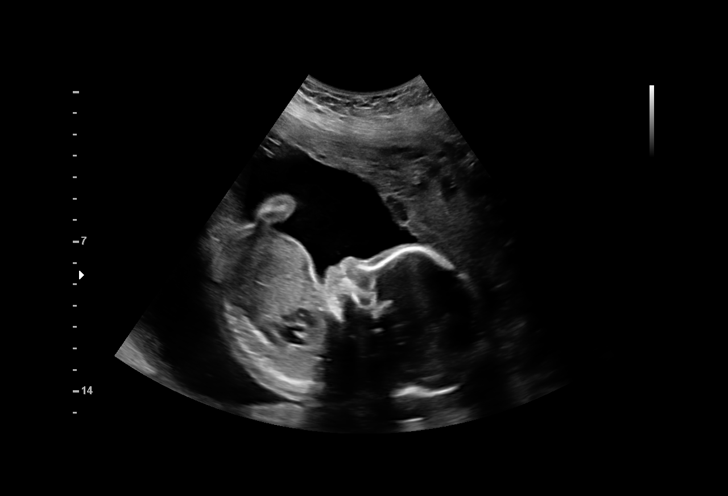
[im 40/44]
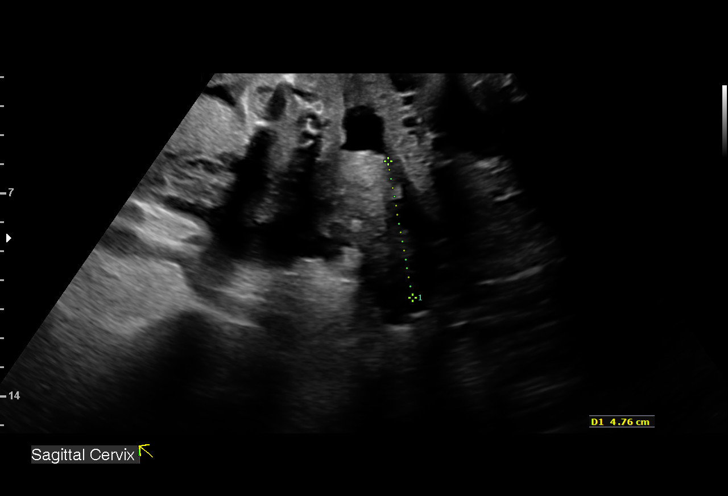
[im 44/44]
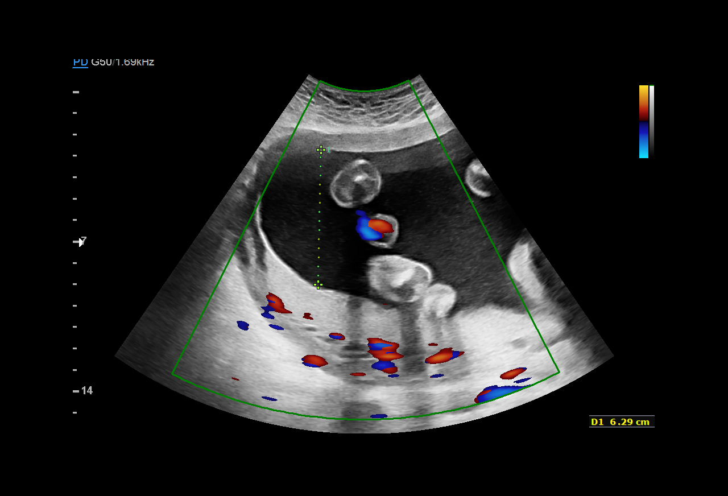

[14 of 28 positions shown; findings below may reference images not displayed]

[REDACTED]care - [HOSPITAL]

Indications

Antenatal follow-up for nonvisualized fetal
anatomy
23 weeks gestation of pregnancy
Hyperthyroid (no medications)
Fetal Evaluation

Num Of Fetuses:         1
Fetal Heart Rate(bpm):  148
Cardiac Activity:       Observed
Presentation:           Cephalic
Placenta:               Posterior
P. Cord Insertion:      Previously Visualized

Amniotic Fluid
AFI FV:      Within normal limits
Biometry

BPD:      59.7  mm     G. Age:  24w 3d         63  %    CI:        79.25   %    70 - 86
FL/HC:      19.3   %    18.7 -
HC:       212   mm     G. Age:  23w 2d         15  %    HC/AC:      1.19        1.05 -
AC:      177.9  mm     G. Age:  22w 5d         12  %    FL/BPD:     68.5   %    71 - 87
FL:       40.9  mm     G. Age:  23w 2d         21  %    FL/AC:      23.0   %    20 - 24

Est. FW:     556  gm      1 lb 4 oz     34  %
OB History

Blood Type:    O+
Gravidity:    6         Term:   5
Living:       5
Gestational Age

LMP:           23w 6d        Date:  01/15/18                 EDD:   10/22/18
U/S Today:     23w 3d                                        EDD:   10/25/18
Best:          23w 6d     Det. By:  LMP  (01/15/18)          EDD:   10/22/18
Anatomy

Cranium:               Appears normal         Aortic Arch:            Previously seen
Cavum:                 Previously seen        Ductal Arch:            Appears normal
Ventricles:            Appears normal         Diaphragm:              Appears normal
Choroid Plexus:        Previously seen        Stomach:                Appears normal, left
sided
Cerebellum:            Previously seen        Abdomen:                Appears normal
Posterior Fossa:       Previously seen        Abdominal Wall:         Previously seen
Nuchal Fold:           Previously seen        Cord Vessels:           Previously seen
Face:                  Orbits and profile     Kidneys:                Appear normal
previously seen
Lips:                  Previously seen        Bladder:                Appears normal
Thoracic:              Appears normal         Spine:                  Previously seen
Heart:                 Appears normal         Upper Extremities:      Previously seen
(4CH, axis, and
situs)
RVOT:                  Appears normal         Lower Extremities:      Previously seen
LVOT:                  Appears normal

Other:  Nasal bone previously visualized. Open hands previously visualized.
Heels previously visualized. Fetus appears to be a female.
Cervix Uterus Adnexa

Cervix
Length:            3.4  cm.
Normal appearance by transabdominal scan.
Impression

Patient returned for completion of fetal anatomy. Amniotic
fluid is normal and good fetal activity is seen. Fetal growth is
appropriate for gestational age.
RVOT is seen.
Recommendations

Fetal growth assessment at 32 weeks.
